# Patient Record
Sex: Female | Born: 1995 | Race: White | Hispanic: No | Marital: Single | Smoking: Current every day smoker
Health system: Southern US, Community
[De-identification: ages and names within clinical notes are randomized; demographics above are authoritative.]

## PROBLEM LIST (undated history)

## (undated) ENCOUNTER — Inpatient Hospital Stay: Payer: Self-pay

## (undated) DIAGNOSIS — N83209 Unspecified ovarian cyst, unspecified side: Secondary | ICD-10-CM

## (undated) DIAGNOSIS — J45909 Unspecified asthma, uncomplicated: Secondary | ICD-10-CM

## (undated) DIAGNOSIS — R894 Abnormal immunological findings in specimens from other organs, systems and tissues: Secondary | ICD-10-CM

## (undated) DIAGNOSIS — N926 Irregular menstruation, unspecified: Secondary | ICD-10-CM

## (undated) DIAGNOSIS — R109 Unspecified abdominal pain: Secondary | ICD-10-CM

## (undated) DIAGNOSIS — T7840XA Allergy, unspecified, initial encounter: Secondary | ICD-10-CM

## (undated) DIAGNOSIS — D689 Coagulation defect, unspecified: Secondary | ICD-10-CM

## (undated) HISTORY — DX: Unspecified ovarian cyst, unspecified side: N83.209

## (undated) HISTORY — DX: Unspecified asthma, uncomplicated: J45.909

## (undated) HISTORY — PX: OVARIAN CYST REMOVAL: SHX89

## (undated) HISTORY — DX: Irregular menstruation, unspecified: N92.6

## (undated) HISTORY — DX: Allergy, unspecified, initial encounter: T78.40XA

## (undated) HISTORY — DX: Unspecified abdominal pain: R10.9

---
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
## 2015-07-08 ENCOUNTER — Encounter: Payer: Self-pay | Admitting: Unknown Physician Specialty

## 2015-07-08 ENCOUNTER — Ambulatory Visit (INDEPENDENT_AMBULATORY_CARE_PROVIDER_SITE_OTHER): Payer: Self-pay | Admitting: Unknown Physician Specialty

## 2015-07-08 VITALS — BP 118/78 | HR 65 | Temp 98.5°F | Ht 62.7 in | Wt 127.0 lb

## 2015-07-08 DIAGNOSIS — Z23 Encounter for immunization: Secondary | ICD-10-CM

## 2015-07-08 DIAGNOSIS — J45909 Unspecified asthma, uncomplicated: Secondary | ICD-10-CM

## 2015-07-08 DIAGNOSIS — J309 Allergic rhinitis, unspecified: Secondary | ICD-10-CM | POA: Insufficient documentation

## 2015-07-08 DIAGNOSIS — K529 Noninfective gastroenteritis and colitis, unspecified: Secondary | ICD-10-CM

## 2015-07-08 DIAGNOSIS — J4520 Mild intermittent asthma, uncomplicated: Secondary | ICD-10-CM | POA: Insufficient documentation

## 2015-07-08 DIAGNOSIS — N926 Irregular menstruation, unspecified: Secondary | ICD-10-CM

## 2015-07-08 NOTE — Progress Notes (Signed)
   BP 118/78 mmHg  Pulse 65  Temp(Src) 98.5 F (36.9 C)  Ht 5' 2.7" (1.593 m)  Wt 127 lb (57.607 kg)  BMI 22.70 kg/m2  SpO2 98%  LMP 06/29/2015 (Exact Date)   Subjective:    Patient ID: Sherry Nunez, female    DOB: 1995-07-14, 20 y.o.   MRN: [id number]  HPI: Sherry Nunez is a 20 y.o. female  Chief Complaint  Patient presents with  . stomach virus    started this weekend, diarrhea   Symptoms started Monday after being around sister with similar symptoms the days before.  She has been vomiting until this AM.  She has diarrhea now.  She is nauseated, has a headache and stomach pain.  She has a sore throat but that is better.   HPI Relevant past medical, surgical, family and social history reviewed and updated as indicated. Interim medical history since our last visit reviewed. Allergies and medications reviewed and updated.  Review of Systems  Per HPI unless specifically indicated above     Objective:    BP 118/78 mmHg  Pulse 65  Temp(Src) 98.5 F (36.9 C)  Ht 5' 2.7" (1.593 m)  Wt 127 lb (57.607 kg)  BMI 22.70 kg/m2  SpO2 98%  LMP 06/29/2015 (Exact Date)  Wt Readings from Last 3 Encounters:  07/08/15 127 lb (57.607 kg)  04/03/14 123 lb (55.792 kg) (40 %*, Z = -0.25)  02/13/12 142 lb (64.411 kg) (78 %*, Z = 0.79)   * Growth percentiles are based on CDC 2-20 Years data.    Physical Exam  Constitutional: She is oriented to person, place, and time. She appears well-developed and well-nourished. No distress.  HENT:  Head: Normocephalic and atraumatic.  Eyes: Conjunctivae and lids are normal. Right eye exhibits no discharge. Left eye exhibits no discharge. No scleral icterus.  Cardiovascular: Normal rate.   Pulmonary/Chest: Effort normal.  Abdominal: Soft. Normal appearance. There is no splenomegaly or hepatomegaly. There is generalized tenderness. There is no rebound and no guarding.  Musculoskeletal: Normal range of motion.  Neurological: She is alert and  oriented to person, place, and time.  Skin: Skin is intact. No rash noted. No pallor.  Psychiatric: She has a normal mood and affect. Her behavior is normal. Judgment and thought content normal.      Assessment & Plan:   Problem List Items Addressed This Visit    None    Visit Diagnoses    Gastroenteritis    -  Primary    Immunization due        Relevant Orders    Flu Vaccine QUAD 36+ mos IM       Supportive care and slow refeeding  Follow up plan: Return if symptoms worsen or fail to improve.

## 2015-07-08 NOTE — Patient Instructions (Signed)

## 2016-02-17 LAB — OB RESULTS CONSOLE HIV ANTIBODY (ROUTINE TESTING): HIV: NONREACTIVE

## 2016-02-17 LAB — OB RESULTS CONSOLE HEPATITIS B SURFACE ANTIGEN: HEP B S AG: NEGATIVE

## 2016-02-17 LAB — OB RESULTS CONSOLE ABO/RH: RH TYPE: POSITIVE

## 2016-02-17 LAB — OB RESULTS CONSOLE ANTIBODY SCREEN: ANTIBODY SCREEN: NEGATIVE

## 2016-02-17 LAB — OB RESULTS CONSOLE RPR: RPR: NONREACTIVE

## 2016-02-17 LAB — OB RESULTS CONSOLE VARICELLA ZOSTER ANTIBODY, IGG: Varicella: IMMUNE

## 2016-02-17 LAB — OB RESULTS CONSOLE HGB/HCT, BLOOD
HCT: 42 %
HEMOGLOBIN: 14.6 g/dL

## 2016-02-17 LAB — OB RESULTS CONSOLE RUBELLA ANTIBODY, IGM: Rubella: IMMUNE

## 2016-02-17 LAB — OB RESULTS CONSOLE GC/CHLAMYDIA
Chlamydia: NEGATIVE
Gonorrhea: NEGATIVE

## 2016-03-11 ENCOUNTER — Encounter: Payer: Self-pay | Admitting: Emergency Medicine

## 2016-03-11 DIAGNOSIS — J45909 Unspecified asthma, uncomplicated: Secondary | ICD-10-CM | POA: Diagnosis not present

## 2016-03-11 DIAGNOSIS — O219 Vomiting of pregnancy, unspecified: Secondary | ICD-10-CM | POA: Diagnosis not present

## 2016-03-11 DIAGNOSIS — O26891 Other specified pregnancy related conditions, first trimester: Secondary | ICD-10-CM | POA: Diagnosis present

## 2016-03-11 DIAGNOSIS — R102 Pelvic and perineal pain: Secondary | ICD-10-CM | POA: Diagnosis not present

## 2016-03-11 DIAGNOSIS — Z3A01 Less than 8 weeks gestation of pregnancy: Secondary | ICD-10-CM | POA: Diagnosis not present

## 2016-03-12 ENCOUNTER — Emergency Department: Payer: Medicaid Other

## 2016-03-12 ENCOUNTER — Emergency Department
Admission: EM | Admit: 2016-03-12 | Discharge: 2016-03-12 | Disposition: A | Discharge status: Home / Self Care | Payer: Medicaid Other | Attending: Emergency Medicine | Admitting: Emergency Medicine

## 2016-03-12 ENCOUNTER — Encounter: Payer: Self-pay | Admitting: *Deleted

## 2016-03-12 DIAGNOSIS — O26891 Other specified pregnancy related conditions, first trimester: Secondary | ICD-10-CM

## 2016-03-12 DIAGNOSIS — R102 Pelvic and perineal pain: Secondary | ICD-10-CM

## 2016-03-12 DIAGNOSIS — R1030 Lower abdominal pain, unspecified: Secondary | ICD-10-CM

## 2016-03-12 LAB — URINALYSIS COMPLETE WITH MICROSCOPIC (ARMC ONLY)
BILIRUBIN URINE: NEGATIVE
Glucose, UA: NEGATIVE mg/dL
HGB URINE DIPSTICK: NEGATIVE
Ketones, ur: NEGATIVE mg/dL
LEUKOCYTES UA: NEGATIVE
NITRITE: NEGATIVE
PH: 7 (ref 5.0–8.0)
PROTEIN: NEGATIVE mg/dL
Specific Gravity, Urine: 1.002 — ABNORMAL LOW (ref 1.005–1.030)

## 2016-03-12 LAB — HCG, QUANTITATIVE, PREGNANCY: hCG, Beta Chain, Quant, S: 121469 m[IU]/mL — ABNORMAL HIGH (ref <5)

## 2016-03-12 NOTE — ED Notes (Addendum)
Pt. States she was told by mid-wife this past Thursday and was told she had small placenta tear.  Pt. Denies any vaginal bleeding.   Pt. States morning sickness symptoms throughout week.  Pt. States some lower abdominal pain and back pain.

## 2016-03-12 NOTE — ED Provider Notes (Signed)
[facility] Emergency Department Provider Note   First MD Initiated Contact with Patient 03/12/16 0209     (approximate)  I have reviewed the triage vital signs and the nursing notes.   HISTORY  Chief Complaint Abdominal Pain    HPI Sherry Nunez is a 20 y.o. female G1 para 0 proximal [redacted] weeks pregnant presents to the emergency department with intermittent sharp lower abdominal pain which started tonight. Patient states that she was seen by midwife at [facility] 2 days ago that the ultrasound that was performed at that time she was told showed a "slight tear between the uterus and the placenta". Patient denies any vaginal bleeding.   Past Medical History:  Diagnosis Date  . Abdominal pain   . Allergy   . Asthma   . Irregular menstrual cycle   . Ovarian cyst     Patient Active Problem List   Diagnosis Date Noted  . Asthma 07/08/2015  . Allergic rhinitis 07/08/2015  . Irregular menstrual cycle 07/08/2015  . Simple constipation 02/13/2012  . Generalized abdominal pain   . Right ovarian cyst     Past Surgical History:  Procedure Laterality Date  . OVARIAN CYST REMOVAL      Prior to Admission medications   Not on File    Allergies Penicillins  Family History  Problem Relation Age of Onset  . GER disease Maternal Uncle     Social History Social History  Substance Use Topics  . Smoking status: Never Smoker  . Smokeless tobacco: Never Used  . Alcohol use Yes    Review of Systems Constitutional: No fever/chills Eyes: No visual changes. ENT: No sore throat. Cardiovascular: Denies chest pain. Respiratory: Denies shortness of breath. Gastrointestinal:Positive for nausea and vomiting No diarrhea.  No constipation.Positive for pelvic pain Genitourinary: Negative for dysuria. Musculoskeletal: Negative for back pain. Skin: Negative for rash. Neurological: Negative for headaches, focal weakness or numbness.  10-point ROS otherwise  negative.  ____________________________________________   PHYSICAL EXAM:  VITAL SIGNS: ED Triage Vitals  Enc Vitals Group     BP 03/11/16 2356 (!) 132/56     Pulse Rate 03/11/16 2356 68     Resp 03/11/16 2356 18     Temp 03/11/16 2356 98.2 F (36.8 C)     Temp Source 03/11/16 2356 Oral     SpO2 03/11/16 2356 100 %     Weight 03/11/16 2358 130 lb (59 kg)     Height 03/11/16 2358 5\' 3"  (1.6 m)     Head Circumference --      Peak Flow --      Pain Score 03/12/16 0005 6     Pain Loc --      Pain Edu? --      Excl. in GC? --     Constitutional: Alert and oriented. Well appearing and in no acute distress. Eyes: Conjunctivae are normal. PERRL. EOMI. Head: Atraumatic. Mouth/Throat: Mucous membranes are moist.  Oropharynx non-erythematous. Neck: No stridor.   Cardiovascular: Normal rate, regular rhythm. Good peripheral circulation. Grossly normal heart sounds. Respiratory: Normal respiratory effort.  No retractions. Lungs CTAB. Gastrointestinal: Soft and nontender. No distention.  Musculoskeletal: No lower extremity tenderness nor edema. No gross deformities of extremities. Neurologic:  Normal speech and language. No gross focal neurologic deficits are appreciated.  Skin:  Skin is warm, dry and intact. No rash noted. Psychiatric: Mood and affect are normal. Speech and behavior are normal.  ____________________________________________   LABS (all labs ordered  are listed, but only abnormal results are displayed)  Labs Reviewed  HCG, QUANTITATIVE, PREGNANCY - Abnormal; Notable for the following:       Result Value   hCG, Beta Chain, Quant, S 121,469 (*)    All other components within normal limits  URINALYSIS COMPLETEWITH MICROSCOPIC ([facility] ONLY) - Abnormal; Notable for the following:    Color, Urine STRAW (*)    APPearance CLEAR (*)    Specific Gravity, Urine 1.002 (*)    Bacteria, UA RARE (*)    Squamous Epithelial / LPF 0-5 (*)    All other components within normal  limits   ___________________________________  RADIOLOGY I, Calamus N BROWN, personally viewed and evaluated these images (plain radiographs) as part of my medical decision making, as well as reviewing the written report by the radiologist.  [place] Ob Comp Less 14 Wks  Result Date: 03/12/2016 CLINICAL DATA:  Acute onset of lower abdominal pain and lower back pain. Initial encounter. EXAM: OBSTETRIC <14 WK US AND TRANSVAGINAL OB US TECHNIQUE: Both transabdominal and transvaginal ultrasound examinations were performed for complete evaluation of the gestation as well as the maternal uterus, adnexal regions, and pelvic cul-de-sac. Transvaginal technique was performed to assess early pregnancy. COMPARISON:  Pelvic ultrasound performed 12/23/2013 FINDINGS: Intrauterine gestational sac: Single; visualized and normal in shape. Yolk sac:  Yes Embryo:  Yes Cardiac Activity: Yes Heart Rate: 140  bpm CRL:  1.2 cm   7 w   3 d                  US EDC: 10/26/2016 Subchorionic hemorrhage:  None visualized. Maternal uterus/adnexae: The uterus is otherwise unremarkable in appearance. A large nabothian cyst is noted at the cervix. The ovaries are within normal limits. The right ovary measures 4.7 x 2.5 x 2.4 cm, while the left ovary measures 3.6 x 2.4 x 2.1 cm. No suspicious adnexal masses are seen; there is no evidence for ovarian torsion. A small amount of free fluid is noted within the pelvic cul-de-sac. IMPRESSION: Single live intrauterine pregnancy noted, with a crown-rump length of 1.2 cm, corresponding to a gestational age of [redacted] weeks 3 days. This matches the gestational age by LMP, reflecting an estimated date of delivery of October 26, 2016. Electronically Signed   By: Roanna RaiderJeffery  Chang M.D.   On: 03/12/2016 02:01   [place] Ob Transvaginal  Result Date: 03/12/2016 CLINICAL DATA:  Acute onset of lower abdominal pain and lower back pain. Initial encounter. EXAM: OBSTETRIC <14 WK US AND TRANSVAGINAL OB US TECHNIQUE: Both  transabdominal and transvaginal ultrasound examinations were performed for complete evaluation of the gestation as well as the maternal uterus, adnexal regions, and pelvic cul-de-sac. Transvaginal technique was performed to assess early pregnancy. COMPARISON:  Pelvic ultrasound performed 12/23/2013 FINDINGS: Intrauterine gestational sac: Single; visualized and normal in shape. Yolk sac:  Yes Embryo:  Yes Cardiac Activity: Yes Heart Rate: 140  bpm CRL:  1.2 cm   7 w   3 d                  US EDC: 10/26/2016 Subchorionic hemorrhage:  None visualized. Maternal uterus/adnexae: The uterus is otherwise unremarkable in appearance. A large nabothian cyst is noted at the cervix. The ovaries are within normal limits. The right ovary measures 4.7 x 2.5 x 2.4 cm, while the left ovary measures 3.6 x 2.4 x 2.1 cm. No suspicious adnexal masses are seen; there is no evidence for ovarian torsion. A small amount of free  fluid is noted within the pelvic cul-de-sac. IMPRESSION: Single live intrauterine pregnancy noted, with a crown-rump length of 1.2 cm, corresponding to a gestational age of [redacted] weeks 3 days. This matches the gestational age by LMP, reflecting an estimated date of delivery of October 26, 2016. Electronically Signed   By: Roanna Raider M.D.   On: 03/12/2016 02:01      Procedures    INITIAL IMPRESSION / ASSESSMENT AND PLAN / ED COURSE  Pertinent labs & imaging results that were available during my care of the patient were reviewed by me and considered in my medical decision making (see chart for details).  Patient advised of clinical warning signs that would warrant return to the emergency department including worsening pain or vaginal bleeding.   Clinical Course    ____________________________________________  FINAL CLINICAL IMPRESSION(S) / ED DIAGNOSES  Final diagnoses:  Pelvic pain affecting pregnancy in first trimester, antepartum     MEDICATIONS GIVEN DURING THIS VISIT:  Medications - No  data to display   NEW OUTPATIENT MEDICATIONS STARTED DURING THIS VISIT:  New Prescriptions   No medications on file    Modified Medications   No medications on file    Discontinued Medications   No medications on file     Note:  This document was prepared using Dragon voice recognition software and may include unintentional dictation errors.    Darci Current, MD 03/12/16 [phone]

## 2016-03-12 NOTE — ED Triage Notes (Signed)
Pt reports she is [redacted] weeks pregnant. Pt was seen by her midwife Thursday for ultrasound and was told that she had a "slight tear between the uterus and placenta". Pt c/o pain in the lower abdomen and lower back that started tonight, no vaginal bleeding or irregular discharge.

## 2016-04-12 ENCOUNTER — Other Ambulatory Visit: Payer: Self-pay | Admitting: Advanced Practice Midwife

## 2016-04-20 ENCOUNTER — Emergency Department
Admission: EM | Admit: 2016-04-20 | Discharge: 2016-04-20 | Disposition: A | Discharge status: Home / Self Care | Payer: Medicaid Other | Attending: Emergency Medicine | Admitting: Emergency Medicine

## 2016-04-20 ENCOUNTER — Emergency Department: Payer: Medicaid Other

## 2016-04-20 ENCOUNTER — Encounter: Payer: Self-pay | Admitting: *Deleted

## 2016-04-20 DIAGNOSIS — F1290 Cannabis use, unspecified, uncomplicated: Secondary | ICD-10-CM | POA: Insufficient documentation

## 2016-04-20 DIAGNOSIS — R102 Pelvic and perineal pain: Secondary | ICD-10-CM

## 2016-04-20 DIAGNOSIS — O99321 Drug use complicating pregnancy, first trimester: Secondary | ICD-10-CM | POA: Insufficient documentation

## 2016-04-20 DIAGNOSIS — R197 Diarrhea, unspecified: Secondary | ICD-10-CM | POA: Insufficient documentation

## 2016-04-20 DIAGNOSIS — Z3A13 13 weeks gestation of pregnancy: Secondary | ICD-10-CM | POA: Insufficient documentation

## 2016-04-20 DIAGNOSIS — J45909 Unspecified asthma, uncomplicated: Secondary | ICD-10-CM | POA: Insufficient documentation

## 2016-04-20 DIAGNOSIS — O26891 Other specified pregnancy related conditions, first trimester: Secondary | ICD-10-CM | POA: Insufficient documentation

## 2016-04-20 DIAGNOSIS — R109 Unspecified abdominal pain: Secondary | ICD-10-CM

## 2016-04-20 DIAGNOSIS — R319 Hematuria, unspecified: Secondary | ICD-10-CM | POA: Diagnosis not present

## 2016-04-20 DIAGNOSIS — O26892 Other specified pregnancy related conditions, second trimester: Secondary | ICD-10-CM

## 2016-04-20 LAB — COMPREHENSIVE METABOLIC PANEL
ALBUMIN: 3.6 g/dL (ref 3.5–5.0)
ALK PHOS: 40 U/L (ref 38–126)
ALT: 12 U/L — ABNORMAL LOW (ref 14–54)
ANION GAP: 6 (ref 5–15)
AST: 22 U/L (ref 15–41)
BUN: 7 mg/dL (ref 6–20)
CALCIUM: 8.8 mg/dL — AB (ref 8.9–10.3)
CHLORIDE: 107 mmol/L (ref 101–111)
CO2: 23 mmol/L (ref 22–32)
Creatinine, Ser: 0.54 mg/dL (ref 0.44–1.00)
GFR calc Af Amer: >60 mL/min (ref >60)
GFR calc non Af Amer: >60 mL/min (ref >60)
GLUCOSE: 72 mg/dL (ref 65–99)
POTASSIUM: 3.5 mmol/L (ref 3.5–5.1)
SODIUM: 136 mmol/L (ref 135–145)
Total Bilirubin: 0.4 mg/dL (ref 0.3–1.2)
Total Protein: 6.8 g/dL (ref 6.5–8.1)

## 2016-04-20 LAB — URINALYSIS COMPLETE WITH MICROSCOPIC (ARMC ONLY)
Bilirubin Urine: NEGATIVE
Glucose, UA: 50 mg/dL — AB
HGB URINE DIPSTICK: NEGATIVE
Ketones, ur: NEGATIVE mg/dL
NITRITE: NEGATIVE
PH: 5 (ref 5.0–8.0)
PROTEIN: NEGATIVE mg/dL
SPECIFIC GRAVITY, URINE: 1.016 (ref 1.005–1.030)

## 2016-04-20 LAB — HCG, QUANTITATIVE, PREGNANCY: HCG, BETA CHAIN, QUANT, S: 106168 m[IU]/mL — AB (ref <5)

## 2016-04-20 LAB — CBC WITH DIFFERENTIAL/PLATELET
BASOS PCT: 1 %
Basophils Absolute: 0 10*3/uL (ref 0–0.1)
EOS ABS: 0.5 10*3/uL (ref 0–0.7)
EOS PCT: 5 %
HCT: 33.1 % — ABNORMAL LOW (ref 35.0–47.0)
HEMOGLOBIN: 11.4 g/dL — AB (ref 12.0–16.0)
Lymphocytes Relative: 14 %
Lymphs Abs: 1.2 10*3/uL (ref 1.0–3.6)
MCH: 31.6 pg (ref 26.0–34.0)
MCHC: 34.4 g/dL (ref 32.0–36.0)
MCV: 91.8 fL (ref 80.0–100.0)
MONOS PCT: 7 %
Monocytes Absolute: 0.6 10*3/uL (ref 0.2–0.9)
NEUTROS PCT: 73 %
Neutro Abs: 6.4 10*3/uL (ref 1.4–6.5)
PLATELETS: 206 10*3/uL (ref 150–440)
RBC: 3.61 MIL/uL — ABNORMAL LOW (ref 3.80–5.20)
RDW: 12.8 % (ref 11.5–14.5)
WBC: 8.7 10*3/uL (ref 3.6–11.0)

## 2016-04-20 MED ORDER — FERROUS SULFATE DRIED ER 160 (50 FE) MG PO TBCR: 160.0000 mg | EXTENDED_RELEASE_TABLET | ORAL | 6 refills | Status: DC

## 2016-04-20 NOTE — ED Notes (Signed)
Patient transported to Ultrasound 

## 2016-04-20 NOTE — ED Triage Notes (Signed)
States sharp left abd pain that began this AM, states she is [redacted] weeks pregnant, states she noticed some vaginal bleeding after she used the bathroom when pain started, states she has experienced morning sickness as well

## 2016-04-20 NOTE — ED Provider Notes (Signed)
[facility] Emergency Department Provider Note        Time seen: ----------------------------------------- 9:01 AM on 04/20/2016 -----------------------------------------    I have reviewed the triage vital signs and the nursing notes.   HISTORY  Chief Complaint Vaginal Bleeding    HPI Sherry Nunez is a 20 y.o. female who presents to ER for abdominal pain that began this morning. Patient states she is [redacted] weeks pregnant. She states she noticed some vaginal bleeding when she went to use the bathroom and wiped. She states it was not coming from her rectum. She has experienced some morning sickness but typically Diclegis has helped her. She denies fevers or chills, has had persistent diarrhea for the last week. She is G1 P0   Past Medical History:  Diagnosis Date  . Abdominal pain   . Allergy   . Asthma   . Irregular menstrual cycle   . Ovarian cyst     Patient Active Problem List   Diagnosis Date Noted  . Asthma 07/08/2015  . Allergic rhinitis 07/08/2015  . Irregular menstrual cycle 07/08/2015  . Simple constipation 02/13/2012  . Generalized abdominal pain   . Right ovarian cyst     Past Surgical History:  Procedure Laterality Date  . OVARIAN CYST REMOVAL      Allergies Penicillins  Social History Social History  Substance Use Topics  . Smoking status: Never Smoker  . Smokeless tobacco: Never Used  . Alcohol use Yes    Review of Systems Constitutional: Negative for fever. Cardiovascular: Negative for chest pain. Respiratory: Negative for shortness of breath. Gastrointestinal: Positive for abdominal pain, diarrhea Genitourinary: Negative for dysuria. positive for hematuria Musculoskeletal: Negative for back pain. Skin: Negative for rash. Neurological: Negative for headaches, focal weakness or numbness.  10-point ROS otherwise negative.  ____________________________________________   PHYSICAL EXAM:  VITAL SIGNS: ED Triage  Vitals [04/20/16 0856]  Enc Vitals Group     BP 111/73     Pulse Rate 75     Resp 18     Temp 98 F (36.7 C)     Temp Source Oral     SpO2 100 %     Weight 144 lb (65.3 kg)     Height 5\' 2"  (1.575 m)     Head Circumference      Peak Flow      Pain Score 8     Pain Loc      Pain Edu?      Excl. in GC?     Constitutional: Alert and oriented. Well appearing and in no distress. Eyes: Conjunctivae are normal. PERRL. Normal extraocular movements. ENT   Head: Normocephalic and atraumatic.   Nose: No congestion/rhinnorhea.   Mouth/Throat: Mucous membranes are moist.   Neck: No stridor. Cardiovascular: Normal rate, regular rhythm. No murmurs, rubs, or gallops. Respiratory: Normal respiratory effort without tachypnea nor retractions. Breath sounds are clear and equal bilaterally. No wheezes/rales/rhonchi. Gastrointestinal: Soft and nontender. Normal bowel sounds Musculoskeletal: Nontender with normal range of motion in all extremities. No lower extremity tenderness nor edema. Neurologic:  Normal speech and language. No gross focal neurologic deficits are appreciated.  Skin:  Skin is warm, dry and intact. No rash noted. Psychiatric: Mood and affect are normal. Speech and behavior are normal.  ___________________________________________  ED COURSE:  Pertinent labs & imaging results that were available during my care of the patient were reviewed by me and considered in my medical decision making (see chart for details). Clinical Course  Patient's no distress, I will assess with labs and likely ultrasound.  Procedures ____________________________________________   LABS (pertinent positives/negatives)  Labs Reviewed  CBC WITH DIFFERENTIAL/PLATELET - Abnormal; Notable for the following:       Result Value   RBC 3.61 (*)    Hemoglobin 11.4 (*)    HCT 33.1 (*)    All other components within normal limits  COMPREHENSIVE METABOLIC PANEL - Abnormal; Notable for the  following:    Calcium 8.8 (*)    ALT 12 (*)    All other components within normal limits  URINALYSIS COMPLETEWITH MICROSCOPIC ([facility] ONLY) - Abnormal; Notable for the following:    Color, Urine YELLOW (*)    APPearance HAZY (*)    Glucose, UA 50 (*)    Leukocytes, UA TRACE (*)    Bacteria, UA RARE (*)    Squamous Epithelial / LPF 6-30 (*)    All other components within normal limits  HCG, QUANTITATIVE, PREGNANCY - Abnormal; Notable for the following:    hCG, Beta Chain, Quant, S 106,168 (*)    All other components within normal limits    RADIOLOGY  Pregnancy ultrasound  IMPRESSION: Single viable injury pregnancy at 13 weeks 0 days . ____________________________________________  FINAL ASSESSMENT AND PLAN  Abdominal pain and pregnancy  Plan: Patient with labs and imaging as dictated above. Patient is in no distress, no specific etiology for symptoms. Labs and imaging are unremarkable. She is stable for outpatient follow-up with OB/GYN.   Emily FilbertWilliams, Jonathan E, MD   Note: This dictation was prepared with Dragon dictation. Any transcriptional errors that result from this process are unintentional    Emily FilbertJonathan E Williams, MD 04/20/16 1100

## 2016-04-21 ENCOUNTER — Ambulatory Visit: Payer: Medicaid Other | Attending: Obstetrics and Gynecology

## 2016-05-05 ENCOUNTER — Ambulatory Visit: Payer: Medicaid Other | Attending: Obstetrics and Gynecology

## 2016-05-23 NOTE — L&D Delivery Note (Signed)
Delivery Note At 10:23 PM a viable female infant was delivered via Vaginal, Spontaneous Delivery (Presentation: LOA ).  APGAR: 7, 9; weight 7 lb 6.9 oz (3370 g).   Placenta status: spontaneous, intact.  Cord: 3VC, nuchal x 1 with the following complications: gestational hypertension, some postpartum atony, 800mcg of cytotec administered rectally.  Cord pH: N/A  Anesthesia:  Epidural Episiotomy: None Lacerations:  none Suture Repair: N/A Est. Blood Loss (mL):  500mL  Mom to postpartum.  Baby to Couplet care / Skin to Skin.  Sherry Nunez 10/19/2016, 10:41 PM

## 2016-06-16 ENCOUNTER — Ambulatory Visit: Payer: Medicaid Other | Attending: Obstetrics and Gynecology

## 2016-06-21 ENCOUNTER — Emergency Department
Admission: EM | Admit: 2016-06-21 | Discharge: 2016-06-21 | Discharge status: Against Medical Advice | Payer: Medicaid Other

## 2016-06-23 ENCOUNTER — Telehealth: Payer: Self-pay | Admitting: Emergency Medicine
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
## 2016-07-06 ENCOUNTER — Other Ambulatory Visit: Payer: Self-pay | Admitting: Obstetrics & Gynecology

## 2016-07-06 DIAGNOSIS — Z362 Encounter for other antenatal screening follow-up: Secondary | ICD-10-CM

## 2016-07-19 ENCOUNTER — Other Ambulatory Visit: Payer: Self-pay | Admitting: Obstetrics & Gynecology

## 2016-07-19 ENCOUNTER — Ambulatory Visit
Admission: RE | Admit: 2016-07-19 | Discharge: 2016-07-19 | Disposition: A | Discharge status: Home / Self Care | Payer: Medicaid Other | Source: Ambulatory Visit | Attending: Obstetrics & Gynecology | Admitting: Obstetrics & Gynecology

## 2016-07-19 DIAGNOSIS — Z3A26 26 weeks gestation of pregnancy: Secondary | ICD-10-CM | POA: Insufficient documentation

## 2016-07-19 DIAGNOSIS — Z369 Encounter for antenatal screening, unspecified: Secondary | ICD-10-CM | POA: Diagnosis not present

## 2016-07-19 DIAGNOSIS — Z362 Encounter for other antenatal screening follow-up: Secondary | ICD-10-CM

## 2016-07-20 ENCOUNTER — Ambulatory Visit (INDEPENDENT_AMBULATORY_CARE_PROVIDER_SITE_OTHER): Payer: Medicaid Other | Admitting: Obstetrics and Gynecology

## 2016-07-20 ENCOUNTER — Encounter: Payer: Self-pay | Admitting: Obstetrics and Gynecology

## 2016-07-20 VITALS — BP 118/76 | Wt 152.0 lb

## 2016-07-20 DIAGNOSIS — O23592 Infection of other part of genital tract in pregnancy, second trimester: Secondary | ICD-10-CM

## 2016-07-20 DIAGNOSIS — O0992 Supervision of high risk pregnancy, unspecified, second trimester: Secondary | ICD-10-CM | POA: Diagnosis not present

## 2016-07-20 DIAGNOSIS — N760 Acute vaginitis: Secondary | ICD-10-CM

## 2016-07-20 DIAGNOSIS — J45909 Unspecified asthma, uncomplicated: Secondary | ICD-10-CM

## 2016-07-20 DIAGNOSIS — Z3A26 26 weeks gestation of pregnancy: Secondary | ICD-10-CM

## 2016-07-20 DIAGNOSIS — O99519 Diseases of the respiratory system complicating pregnancy, unspecified trimester: Secondary | ICD-10-CM

## 2016-07-20 DIAGNOSIS — Z131 Encounter for screening for diabetes mellitus: Secondary | ICD-10-CM

## 2016-07-20 DIAGNOSIS — Z113 Encounter for screening for infections with a predominantly sexual mode of transmission: Secondary | ICD-10-CM

## 2016-07-20 LAB — POCT WET PREP WITH KOH
Clue Cells Wet Prep HPF POC: NEGATIVE
KOH Prep POC: NEGATIVE
Trichomonas, UA: NEGATIVE
Yeast Wet Prep HPF POC: NEGATIVE

## 2016-07-20 NOTE — Progress Notes (Signed)
[facility] screen [facility] at [facility].  No vb. No lof. States she is having some vaginal swelling. Would like a pelvic exam to rule out vaginitis.  Wet prep neg. nuwab sent.

## 2016-07-25 LAB — NUSWAB VAGINITIS PLUS (VG+)
CANDIDA ALBICANS, NAA: POSITIVE — AB
CHLAMYDIA TRACHOMATIS, NAA: NEGATIVE
Candida glabrata, NAA: NEGATIVE
MEGASPHAERA 1: HIGH {score} — AB
Neisseria gonorrhoeae, NAA: NEGATIVE
TRICH VAG BY NAA: NEGATIVE

## 2016-07-26 NOTE — Progress Notes (Signed)
Pt aware.

## 2016-08-03 ENCOUNTER — Other Ambulatory Visit: Payer: Medicaid Other

## 2016-08-03 ENCOUNTER — Ambulatory Visit (INDEPENDENT_AMBULATORY_CARE_PROVIDER_SITE_OTHER): Payer: Medicaid Other | Admitting: Advanced Practice Midwife

## 2016-08-03 VITALS — BP 116/70 | Wt 156.0 lb

## 2016-08-03 DIAGNOSIS — Z3A28 28 weeks gestation of pregnancy: Secondary | ICD-10-CM

## 2016-08-03 DIAGNOSIS — Z131 Encounter for screening for diabetes mellitus: Secondary | ICD-10-CM

## 2016-08-03 DIAGNOSIS — Z3400 Encounter for supervision of normal first pregnancy, unspecified trimester: Secondary | ICD-10-CM | POA: Insufficient documentation

## 2016-08-03 DIAGNOSIS — O0992 Supervision of high risk pregnancy, unspecified, second trimester: Secondary | ICD-10-CM

## 2016-08-03 DIAGNOSIS — Z113 Encounter for screening for infections with a predominantly sexual mode of transmission: Secondary | ICD-10-CM

## 2016-08-03 NOTE — Progress Notes (Signed)
C/o tooth pain related to wisdom teeth coming in. Dental instructions given. 28 week labs today.

## 2016-08-04 LAB — 28 WEEK RH+PANEL
BASOS ABS: 0 10*3/uL (ref 0.0–0.2)
Basos: 0 %
EOS (ABSOLUTE): 0.5 10*3/uL — ABNORMAL HIGH (ref 0.0–0.4)
EOS: 3 %
GESTATIONAL DIABETES SCREEN: 119 mg/dL (ref 65–139)
HIV Screen 4th Generation wRfx: NONREACTIVE
Hematocrit: 29.6 % — ABNORMAL LOW (ref 34.0–46.6)
Hemoglobin: 10 g/dL — ABNORMAL LOW (ref 11.1–15.9)
IMMATURE GRANS (ABS): 0.5 10*3/uL — AB (ref 0.0–0.1)
IMMATURE GRANULOCYTES: 3 %
LYMPHS: 14 %
Lymphocytes Absolute: 2.2 10*3/uL (ref 0.7–3.1)
MCH: 31.1 pg (ref 26.6–33.0)
MCHC: 33.8 g/dL (ref 31.5–35.7)
MCV: 92 fL (ref 79–97)
MONOS ABS: 1.1 10*3/uL — AB (ref 0.1–0.9)
Monocytes: 7 %
NEUTROS PCT: 73 %
Neutrophils Absolute: 11 10*3/uL — ABNORMAL HIGH (ref 1.4–7.0)
PLATELETS: 206 10*3/uL (ref 150–379)
RBC: 3.22 x10E6/uL — ABNORMAL LOW (ref 3.77–5.28)
RDW: 13.1 % (ref 12.3–15.4)
RPR: NONREACTIVE
WBC: 15.3 10*3/uL — ABNORMAL HIGH (ref 3.4–10.8)

## 2016-08-10 ENCOUNTER — Observation Stay
Admission: EM | Admit: 2016-08-10 | Discharge: 2016-08-10 | Disposition: A | Discharge status: Home / Self Care | Payer: Medicaid Other | Attending: Obstetrics and Gynecology | Admitting: Obstetrics and Gynecology

## 2016-08-10 DIAGNOSIS — Z880 Allergy status to penicillin: Secondary | ICD-10-CM | POA: Insufficient documentation

## 2016-08-10 DIAGNOSIS — Z3A29 29 weeks gestation of pregnancy: Secondary | ICD-10-CM | POA: Insufficient documentation

## 2016-08-10 DIAGNOSIS — O368130 Decreased fetal movements, third trimester, not applicable or unspecified: Principal | ICD-10-CM | POA: Insufficient documentation

## 2016-08-10 MED ORDER — ACETAMINOPHEN 325 MG PO TABS: 650.0000 mg | ORAL_TABLET | ORAL | Status: DC | PRN

## 2016-08-10 NOTE — Final Progress Note (Signed)
Physician Final Progress Note  Patient ID: Sherry CannyKayli B Harshfield MRN: [id number] DOB/AGE: Aug 25, 1995 21 y.o.  Admit date: 08/10/2016 Admitting provider: Vena AustriaAndreas Nicolai Labonte, MD Discharge date: 08/10/2016   Admission Diagnoses: Decreased fetal movement  Discharge Diagnoses:  Active Problems:   Labor and delivery indication for care or intervention  21 yo G1P0 .at 8050w0d presenting to L&D with chief complaint of decreased fetal movement  Consults: None  Significant Findings/ Diagnostic Studies: none  Procedures:  NST Baseline: 130 Variability: moderate Accelerations: present Decelerations: absent Tocometry: none The patient was monitored for 30 minutes, fetal heart rate tracing was deemed reactive, category I tracing,   Discharge Condition: good  Disposition: 07-Left Against Medical Advice/Left Without Being Seen/Elopement  Diet: Regular diet  Discharge Activity: Activity as tolerated  Discharge Instructions    Discharge activity:  No Restrictions    Complete by:  As directed    Discharge diet:  No restrictions    Complete by:  As directed    No sexual activity restrictions    Complete by:  As directed    Notify physician for a general feeling that "something is not right"    Complete by:  As directed    Notify physician for increase or change in vaginal discharge    Complete by:  As directed    Notify physician for intestinal cramps, with or without diarrhea, sometimes described as "gas pain"    Complete by:  As directed    Notify physician for leaking of fluid    Complete by:  As directed    Notify physician for low, dull backache, unrelieved by heat or Tylenol    Complete by:  As directed    Notify physician for menstrual like cramps    Complete by:  As directed    Notify physician for pelvic pressure    Complete by:  As directed    Notify physician for uterine contractions.  These may be painless and feel like the uterus is tightening or the baby is  "balling up"     Complete by:  As directed    Notify physician for vaginal bleeding    Complete by:  As directed    PRETERM LABOR:  Includes any of the follwing symptoms that occur between 20 - [redacted] weeks gestation.  If these symptoms are not stopped, preterm labor can result in preterm delivery, placing your baby at risk    Complete by:  As directed      Allergies as of 08/10/2016      Reactions   Penicillins       Medication List    TAKE these medications   ferrous sulfate 160 (50 Fe) MG Tbcr SR tablet Commonly known as:  EQL SLOW RELEASE IRON Take 1 tablet (160 mg total) by mouth 3 (three) times a week.        Total time spent taking care of this patient: 15 minutes triaged via phone.  NST reviewed  Signed: Vena Austriandreas Michiko Lineman 08/10/2016, 11:38 PM

## 2016-08-17 ENCOUNTER — Telehealth: Payer: Self-pay

## 2016-08-17 NOTE — Telephone Encounter (Signed)
Pt calling wanting to know what to do about her tooth.  Needs to make sure she can get done what needs to be done.  Has appt here Mon and c Dr. Willaim BanePark at [facility] Mon.  Adv we have a dental protocol we can fax to her dentist.  Pt gave fax # of [phone].  Protocol faxed and confirmation received.

## 2016-08-22 ENCOUNTER — Ambulatory Visit (INDEPENDENT_AMBULATORY_CARE_PROVIDER_SITE_OTHER): Payer: Medicaid Other | Admitting: Advanced Practice Midwife

## 2016-08-22 VITALS — BP 118/72 | Wt 160.0 lb

## 2016-08-22 DIAGNOSIS — Z3A30 30 weeks gestation of pregnancy: Secondary | ICD-10-CM

## 2016-08-22 NOTE — Progress Notes (Signed)
Doing well. Having BH ctx's. Encouraged to stay hydrated. Mild anemia noted on 28 week labs- Fe samples given. Explained GBS to patient and her mom per their questions.

## 2016-08-22 NOTE — Patient Instructions (Signed)
Pregnancy and Anemia Anemia is a condition in which the concentration of red blood cells or hemoglobin in the blood is below normal. Hemoglobin is a substance in red blood cells that carries oxygen to the tissues of the body. Anemia results in not enough oxygen reaching these tissues. Anemia during pregnancy is common because the fetus uses more iron and folic acid as it is developing. Your body may not produce enough red blood cells because of this. Also, during pregnancy, the liquid part of the blood (plasma) increases by about 50%, and the red blood cells increase by only 25%. This lowers the concentration of the red blood cells and creates a natural anemia-like situation. What are the causes? The most common cause of anemia during pregnancy is not having enough iron in the body to make red blood cells (iron deficiency anemia). Other causes may include:  Folic acid deficiency.  Vitamin B12 deficiency.  Certain prescription or over-the-counter medicines.  Certain medical conditions or infections that destroy red blood cells.  A low platelet count and bleeding caused by antibodies that go through the placenta to the fetus from the mother's blood. What are the signs or symptoms? Mild anemia may not be noticeable. If it becomes severe, symptoms may include:  Tiredness.  Shortness of breath, especially with exercise.  Weakness.  Fainting.  Pale looking skin.  Headaches.  Feeling a fast or irregular heartbeat (palpitations). How is this diagnosed? The type of anemia is usually diagnosed from your family and medical history and blood tests. How is this treated? Treatment of anemia during pregnancy depends on the cause of the anemia. Treatment can include:  Supplements of iron, vitamin B12, or folic acid.  A blood transfusion. This may be needed if blood loss is severe.  Hospitalization. This may be needed if there is significant continual blood loss.  Dietary changes. Follow  these instructions at home:  Follow your dietitian's or health care provider's dietary recommendations.  Increase your vitamin C intake. This will help the stomach absorb more iron.  Eat a diet rich in iron. This would include foods such as:  Liver.  Beef.  Whole grain bread.  Eggs.  Dried fruit.  Take iron and vitamins as directed by your health care provider.  Eat green leafy vegetables. These are a good source of folic acid. Contact a health care provider if:  You have frequent or lasting headaches.  You are looking pale.  You are bruising easily. Get help right away if:  You have extreme weakness, shortness of breath, or chest pain.  You become dizzy or have trouble concentrating.  You have heavy vaginal bleeding.  You develop a rash.  You have bloody or black, tarry stools.  You faint.  You vomit up blood.  You vomit repeatedly.  You have abdominal pain.  You have a fever or persistent symptoms for more than 2-3 days.  You have a fever and your symptoms suddenly get worse.  You are dehydrated. This information is not intended to replace advice given to you by your health care provider. Make sure you discuss any questions you have with your health care provider. Document Released: 05/06/2000 Document Revised: 10/15/2015 Document Reviewed: 12/19/2012 Elsevier Interactive Patient Education  2017 Elsevier Inc.  

## 2016-08-25 DIAGNOSIS — Z8619 Personal history of other infectious and parasitic diseases: Secondary | ICD-10-CM | POA: Insufficient documentation

## 2016-08-25 DIAGNOSIS — E039 Hypothyroidism, unspecified: Secondary | ICD-10-CM | POA: Insufficient documentation

## 2016-08-25 DIAGNOSIS — Z6834 Body mass index (BMI) 34.0-34.9, adult: Secondary | ICD-10-CM | POA: Insufficient documentation

## 2016-08-26 NOTE — Discharge Summary (Signed)
See final progress note. 

## 2016-09-05 ENCOUNTER — Ambulatory Visit (INDEPENDENT_AMBULATORY_CARE_PROVIDER_SITE_OTHER): Payer: Medicaid Other | Admitting: Obstetrics and Gynecology

## 2016-09-05 VITALS — BP 118/76 | Wt 161.0 lb

## 2016-09-05 DIAGNOSIS — Z3400 Encounter for supervision of normal first pregnancy, unspecified trimester: Secondary | ICD-10-CM

## 2016-09-05 DIAGNOSIS — Z6834 Body mass index (BMI) 34.0-34.9, adult: Secondary | ICD-10-CM

## 2016-09-05 DIAGNOSIS — Z3A32 32 weeks gestation of pregnancy: Secondary | ICD-10-CM

## 2016-09-05 NOTE — Progress Notes (Signed)
No vb. No lof.  

## 2016-09-12 ENCOUNTER — Observation Stay
Admission: EM | Admit: 2016-09-12 | Discharge: 2016-09-12 | Disposition: A | Discharge status: Home / Self Care | Payer: Medicaid Other | Attending: Obstetrics and Gynecology | Admitting: Obstetrics and Gynecology

## 2016-09-12 DIAGNOSIS — Z3A33 33 weeks gestation of pregnancy: Secondary | ICD-10-CM | POA: Insufficient documentation

## 2016-09-12 DIAGNOSIS — O26893 Other specified pregnancy related conditions, third trimester: Principal | ICD-10-CM | POA: Insufficient documentation

## 2016-09-12 DIAGNOSIS — Z880 Allergy status to penicillin: Secondary | ICD-10-CM | POA: Diagnosis not present

## 2016-09-12 DIAGNOSIS — R109 Unspecified abdominal pain: Secondary | ICD-10-CM | POA: Insufficient documentation

## 2016-09-12 NOTE — Discharge Instructions (Signed)
LABOR: When contractions begin, you should start to time them from the beginning of one contraction to the beginning of the next.  When contractions are 5-10 minutes apart or less and have been regular for at least an hour, you should call your health care provider.  Notify your doctor if any of the following occur: 1. Bleeding from the vagina 7. Sudden, constant, or occasional abdominal pain  2. Pain or burning when urinating 8. Sudden gushing of fluid from the vagina (with or without continued leaking)  3. Chills or fever 9. Fainting spells, "black outs" or loss of consciousness  4. Increase in vaginal discharge 10. Severe or continued nausea or vomiting  5. Pelvic pressure (sudden increase) 11. Blurring of vision or spots before the eyes  6. Baby moving less than usual 12. Leaking of fluid    FETAL KICK COUNT: Lie on your left side for one hour after a meal, and count the number of times your baby kicks. If it is less than 5 times, get up, move around and drink some juice. Repeat the test 30 minutes later. If it is still less than 5 kicks in an hour, notify your doctor.Braxton Hicks Contractions Contractions of the uterus can occur throughout pregnancy, but they are not always a sign that you are in labor. You may have practice contractions called Braxton Hicks contractions. These false labor contractions are sometimes confused with true labor. What are Sherry Nunez contractions? Braxton Hicks contractions are tightening movements that occur in the muscles of the uterus before labor. Unlike true labor contractions, these contractions do not result in opening (dilation) and thinning of the cervix. Toward the end of pregnancy (32-34 weeks), Braxton Hicks contractions can happen more often and may become stronger. These contractions are sometimes difficult to tell apart from true labor because they can be very uncomfortable. You should not feel embarrassed if you go to the hospital with false  labor. Sometimes, the only way to tell if you are in true labor is for your health care provider to look for changes in the cervix. The health care provider will do a physical exam and may monitor your contractions. If you are not in true labor, the exam should show that your cervix is not dilating and your water has not broken. If there are no prenatal problems or other health problems associated with your pregnancy, it is completely safe for you to be sent home with false labor. You may continue to have Braxton Hicks contractions until you go into true labor. How can I tell the difference between true labor and false labor?  Differences  False labor  Contractions last 30-70 seconds.: Contractions are usually shorter and not as strong as true labor contractions.  Contractions become very regular.: Contractions are usually irregular.  Discomfort is usually felt in the top of the uterus, and it spreads to the lower abdomen and low back.: Contractions are often felt in the front of the lower abdomen and in the groin.  Contractions do not go away with walking.: Contractions may go away when you walk around or change positions while lying down.  Contractions usually become more intense and increase in frequency.: Contractions get weaker and are shorter-lasting as time goes on.  The cervix dilates and gets thinner.: The cervix usually does not dilate or become thin. Follow these instructions at home:  Take over-the-counter and prescription medicines only as told by your health care provider.  Keep up with your usual exercises and follow  other instructions from your health care provider.  Eat and drink lightly if you think you are going into labor.  If Braxton Hicks contractions are making you uncomfortable:  Change your position from lying down or resting to walking, or change from walking to resting.  Sit and rest in a tub of warm water.  Drink enough fluid to keep your urine clear or  pale yellow. Dehydration may cause these contractions.  Do slow and deep breathing several times an hour.  Keep all follow-up prenatal visits as told by your health care provider. This is important. Contact a health care provider if:  You have a fever.  You have continuous pain in your abdomen. Get help right away if:  Your contractions become stronger, more regular, and closer together.  You have fluid leaking or gushing from your vagina.  You pass blood-tinged mucus (bloody show).  You have bleeding from your vagina.  You have low back pain that you never had before.  You feel your babys head pushing down and causing pelvic pressure.  Your baby is not moving inside you as much as it used to. Summary  Contractions that occur before labor are called Braxton Hicks contractions, false labor, or practice contractions.  Braxton Hicks contractions are usually shorter, weaker, farther apart, and less regular than true labor contractions. True labor contractions usually become progressively stronger and regular and they become more frequent.  Manage discomfort from [facility] contractions by changing position, resting in a warm bath, drinking plenty of water, or practicing deep breathing. This information is not intended to replace advice given to you by your health care provider. Make sure you discuss any questions you have with your health care provider. Document Released: 05/09/2005 Document Revised: 03/28/2016 Document Reviewed: 03/28/2016 Elsevier Interactive Patient Education  2017 [employer].

## 2016-09-12 NOTE — Discharge Summary (Signed)
Physician Final Progress Note  Patient ID: Sherry Nunez MRN: [id number] DOB/AGE: 22-Oct-1995 22 y.o.  Admit date: 09/12/2016 Admitting provider: Tresea Mall, CNM Discharge date: 09/12/2016   Admission Diagnoses: abdominal pain  Discharge Diagnoses:  Active Problems:   * No active hospital problems. * IUP at [redacted]w[redacted]d with reactive NST, not in labor  History of Present Illness: The patient is a 21 y.o. female G1P0 at [redacted]w[redacted]d who presents for abdominal pain for the last hour and a half that is described as constant and also as coming and going every 20 minutes. The patient admits positive fetal movement. She admits braxton hicks contractions. She denies LOF, VB, pain with urination. She admits to adequate hydration.    Past Medical History:  Diagnosis Date  . Abdominal pain   . Allergy   . Asthma   . Irregular menstrual cycle   . Ovarian cyst     Past Surgical History:  Procedure Laterality Date  . OVARIAN CYST REMOVAL      No current facility-administered medications on file prior to encounter.    Current Outpatient Prescriptions on File Prior to Encounter  Medication Sig Dispense Refill  . ferrous sulfate (EQL SLOW RELEASE IRON) 160 (50 Fe) MG TBCR SR tablet Take 1 tablet (160 mg total) by mouth 3 (three) times a week. 30 each 6    Allergies  Allergen Reactions  . Penicillins     Social History   Social History  . Marital status: Single    Spouse name: N/A  . Number of children: N/A  . Years of education: N/A   Occupational History  . Not on file.   Social History Main Topics  . Smoking status: Never Smoker  . Smokeless tobacco: Never Used  . Alcohol use Yes  . Drug use: Yes    Types: Marijuana  . Sexual activity: Yes   Other Topics Concern  . Not on file   Social History Narrative   12th grade    Physical Exam: LMP 12/20/2015   Gen: NAD CV: RRR Pulm: CTAB Pelvic: closed, thick, -3, posterior Toco: occasional irritability Fetal Well Being: 120  bpm, moderate variability, +accels, -decels Ext: no evidence of DVT  Consults: None  Significant Findings/ Diagnostic Studies: none  Procedures: NST  Discharge Condition: good  Disposition: 01-Home or Self Care  Diet: Regular diet  Discharge Activity: Activity as tolerated  Discharge Instructions    Discharge activity:  No Restrictions    Complete by:  As directed    Discharge diet:  No restrictions    Complete by:  As directed    Fetal Kick Count:  Lie on our left side for one hour after a meal, and count the number of times your baby kicks.  If it is less than 5 times, get up, move around and drink some juice.  Repeat the test 30 minutes later.  If it is still less than 5 kicks in an hour, notify your doctor.    Complete by:  As directed    No sexual activity restrictions    Complete by:  As directed    Notify physician for a general feeling that "something is not right"    Complete by:  As directed    Notify physician for increase or change in vaginal discharge    Complete by:  As directed    Notify physician for intestinal cramps, with or without diarrhea, sometimes described as "gas pain"    Complete by:  As directed  Notify physician for leaking of fluid    Complete by:  As directed    Notify physician for low, dull backache, unrelieved by heat or Tylenol    Complete by:  As directed    Notify physician for menstrual like cramps    Complete by:  As directed    Notify physician for pelvic pressure    Complete by:  As directed    Notify physician for uterine contractions.  These may be painless and feel like the uterus is tightening or the baby is  "balling up"    Complete by:  As directed    Notify physician for vaginal bleeding    Complete by:  As directed    PRETERM LABOR:  Includes any of the follwing symptoms that occur between 20 - [redacted] weeks gestation.  If these symptoms are not stopped, preterm labor can result in preterm delivery, placing your baby at risk     Complete by:  As directed      Allergies as of 09/12/2016      Reactions   Penicillins       Medication List    TAKE these medications   ferrous sulfate 160 (50 Fe) MG Tbcr SR tablet Commonly known as:  EQL SLOW RELEASE IRON Take 1 tablet (160 mg total) by mouth 3 (three) times a week.      Follow-up Information    [facility] Follow up.   Why:  go to regular scheduled prenatal appointment Contact information: [address] [place] [address] [phone]          Total time spent taking care of this patient: 15 minutes  Signed: Tresea Mall, CNM  09/12/2016, 1:14 AM

## 2016-09-19 ENCOUNTER — Ambulatory Visit (INDEPENDENT_AMBULATORY_CARE_PROVIDER_SITE_OTHER): Payer: Medicaid Other

## 2016-09-19 VITALS — BP 120/90 | Wt 165.0 lb

## 2016-09-19 DIAGNOSIS — Z3402 Encounter for supervision of normal first pregnancy, second trimester: Secondary | ICD-10-CM | POA: Diagnosis not present

## 2016-09-19 DIAGNOSIS — Z3400 Encounter for supervision of normal first pregnancy, unspecified trimester: Secondary | ICD-10-CM

## 2016-09-19 DIAGNOSIS — Z23 Encounter for immunization: Secondary | ICD-10-CM

## 2016-09-19 DIAGNOSIS — Z3A34 34 weeks gestation of pregnancy: Secondary | ICD-10-CM

## 2016-09-19 MED ORDER — FERROUS SULFATE DRIED ER 160 (50 FE) MG PO TBCR: 160.0000 mg | EXTENDED_RELEASE_TABLET | Freq: Every day | ORAL | 6 refills | Status: DC

## 2016-09-19 NOTE — Progress Notes (Signed)
Pos PNVs/Fe. Fe RX eRxd. No VB, LOF. Breast/OCPs. Occas BH contrxns. Contrxn sx improved from last wk at L&D. TdAP today.

## 2016-09-19 NOTE — Addendum Note (Signed)
Addended by: Loran Senters D on: 09/19/2016 10:44 AM   Modules accepted: Orders

## 2016-09-27 ENCOUNTER — Ambulatory Visit (INDEPENDENT_AMBULATORY_CARE_PROVIDER_SITE_OTHER): Payer: Medicaid Other | Admitting: Obstetrics and Gynecology

## 2016-09-27 VITALS — BP 126/62 | Wt 168.0 lb

## 2016-09-27 DIAGNOSIS — Z3A35 35 weeks gestation of pregnancy: Secondary | ICD-10-CM

## 2016-09-27 DIAGNOSIS — Z3685 Encounter for antenatal screening for Streptococcus B: Secondary | ICD-10-CM

## 2016-09-27 DIAGNOSIS — Z3400 Encounter for supervision of normal first pregnancy, unspecified trimester: Secondary | ICD-10-CM

## 2016-09-27 DIAGNOSIS — Z113 Encounter for screening for infections with a predominantly sexual mode of transmission: Secondary | ICD-10-CM

## 2016-09-27 NOTE — Progress Notes (Signed)
Discussed hemrrhoids in pregnancy, no external hemerrhoids one time episode only noticed when whipping.  No constipation.  GBS and aptima collected today

## 2016-09-27 NOTE — Progress Notes (Signed)
Bellybutton pain/ bleeding w/BM once

## 2016-09-27 NOTE — Patient Instructions (Signed)
Lower Gastrointestinal Bleeding °Lower gastrointestinal (GI) bleeding is the result of bleeding from the colon, rectum, or anal area. The colon is the last part of the digestive tract, where stool, also called feces, is formed. If you have lower GI bleeding, you may see blood in or on your stool. It may be bright red. °Lower GI bleeding often stops without treatment. Continued or heavy bleeding needs emergency treatment at the hospital. °What are the causes? °Lower GI bleeding may be caused by: °· A condition that causes pouches to form in the colon over time (diverticulosis). °· Swelling and irritation (inflammation) in areas with diverticulosis (diverticulitis). °· Inflammation of the colon (inflammatory bowel disease). °· Swollen veins in the rectum (hemorrhoids). °· Painful tears in the anus (anal fissures), often caused by passing hard stools. °· Cancer of the colon or rectum. °· Noncancerous growths (polyps) of the colon or rectum. °· A bleeding disorder that impairs the formation of blood clots and causes easy bleeding (coagulopathy). °· An abnormal weakening of a blood vessel where an artery and a vein come together (arteriovenous malformation). °What increases the risk? °You are more likely to develop this condition if: °· You are older than 22 years of age. °· You take aspirin or NSAIDs on a regular basis. °· You take anticoagulant or antiplatelet drugs. °· You have a history of high-dose X-ray treatment (radiation therapy) of the colon. °· You recently had a colon polyp removed. °What are the signs or symptoms? °Symptoms of this condition include: °· Bright red blood or blood clots coming from your rectum. °· Bloody stools. °· Black or maroon-colored stools. °· Pain or cramping in the abdomen. °· Weakness or dizziness. °· Racing heartbeat. °How is this diagnosed? °This condition may be diagnosed based on: °· Your symptoms and medical history. °· A physical exam. During the exam, your health care provider  will check for signs of blood loss, such as low blood pressure and a rapid pulse. °· Tests, such as: °¨ Flexible sigmoidoscopy. In this procedure, a flexible tube with a camera on the end is used to examine your anus and the first part of your colon to look for the source of bleeding. °¨ Colonoscopy. This is similar to a flexible sigmoidoscopy, but the camera can extend all the way to the uppermost part of your colon. °¨ Blood tests to measure your red blood cell count and to check for coagulopathy. °¨ An imaging study of your colon to look for a bleeding site. In some cases, you may have X-rays taken after a dye or radioactive substance is injected into your bloodstream (angiogram). °How is this treated? °Treatment for this condition depends on the cause of the bleeding. Heavy or persistent bleeding is treated at the hospital. Treatment may include: °· Getting fluids through an IV tube inserted into one of your veins. °· Getting blood through an IV tube (blood transfusion). °· Stopping bleeding through high-heat coagulation, injections of certain medicines, or applying surgical clips. This can all be done during a colonoscopy. °· Having a procedure that involves first doing an angiogram and then blocking blood flow to the bleeding site (embolization). °· Stopping some of your regular medicines for a certain amount of time. °· Having surgery to remove part of the colon. This may be needed if bleeding is severe and does not respond to other treatment. °Follow these instructions at home: °· Take over-the-counter and prescription medicines only as told by your health care provider. You may need to avoid   aspirin, NSAIDs, or other medicines that increase bleeding. °· Eat foods that are high in fiber. This will help keep your stools soft. These foods include whole grains, legumes, fruits, and vegetables. Eating 1-3 prunes each day works well for many people. °· Drink enough fluid to keep your urine clear or pale  yellow. °· Keep all follow-up visits as told by your health care provider. This is important. °Contact a health care provider if: °· Your symptoms do not improve. °Get help right away if: °· Your bleeding increases. °· You feel light-headed or you faint. °· You feel weak. °· You have severe cramps in your back or abdomen. °· You pass large blood clots in your stool. °· Your symptoms get worse. °This information is not intended to replace advice given to you by your health care provider. Make sure you discuss any questions you have with your health care provider. °Document Released: 09/24/2015 Document Revised: 10/15/2015 Document Reviewed: 09/24/2015 °Elsevier Interactive Patient Education © 2017 Elsevier Inc. ° °

## 2016-09-29 LAB — STREP GP B NAA: Strep Gp B NAA: NEGATIVE

## 2016-10-01 LAB — GC/CHLAMYDIA PROBE AMP
Chlamydia trachomatis, NAA: NEGATIVE
Neisseria gonorrhoeae by PCR: NEGATIVE

## 2016-10-03 ENCOUNTER — Ambulatory Visit (INDEPENDENT_AMBULATORY_CARE_PROVIDER_SITE_OTHER): Payer: Medicaid Other | Admitting: Obstetrics and Gynecology

## 2016-10-03 VITALS — BP 122/70 | Wt 170.0 lb

## 2016-10-03 DIAGNOSIS — Z8619 Personal history of other infectious and parasitic diseases: Secondary | ICD-10-CM

## 2016-10-03 DIAGNOSIS — Z3400 Encounter for supervision of normal first pregnancy, unspecified trimester: Secondary | ICD-10-CM

## 2016-10-03 DIAGNOSIS — Z6834 Body mass index (BMI) 34.0-34.9, adult: Secondary | ICD-10-CM

## 2016-10-03 DIAGNOSIS — Z3A36 36 weeks gestation of pregnancy: Secondary | ICD-10-CM

## 2016-10-03 NOTE — Progress Notes (Signed)
No vb. No lof.  

## 2016-10-10 ENCOUNTER — Encounter: Payer: Medicaid Other | Admitting: Obstetrics and Gynecology

## 2016-10-10 ENCOUNTER — Ambulatory Visit (INDEPENDENT_AMBULATORY_CARE_PROVIDER_SITE_OTHER): Payer: Medicaid Other | Admitting: Obstetrics & Gynecology

## 2016-10-10 VITALS — BP 130/88 | Wt 172.0 lb

## 2016-10-10 DIAGNOSIS — Z3A37 37 weeks gestation of pregnancy: Secondary | ICD-10-CM

## 2016-10-10 DIAGNOSIS — Z3400 Encounter for supervision of normal first pregnancy, unspecified trimester: Secondary | ICD-10-CM

## 2016-10-10 NOTE — Progress Notes (Signed)
PNV, FMC, Labor precautions  

## 2016-10-14 ENCOUNTER — Encounter: Payer: Self-pay | Admitting: *Deleted

## 2016-10-14 ENCOUNTER — Observation Stay
Admission: EM | Admit: 2016-10-14 | Discharge: 2016-10-14 | Disposition: A | Discharge status: Home / Self Care | Payer: Medicaid Other | Attending: Obstetrics and Gynecology | Admitting: Obstetrics and Gynecology

## 2016-10-14 DIAGNOSIS — M545 Low back pain, unspecified: Secondary | ICD-10-CM | POA: Diagnosis present

## 2016-10-14 DIAGNOSIS — Z3A38 38 weeks gestation of pregnancy: Secondary | ICD-10-CM

## 2016-10-14 DIAGNOSIS — O26893 Other specified pregnancy related conditions, third trimester: Secondary | ICD-10-CM | POA: Diagnosis not present

## 2016-10-14 DIAGNOSIS — O368130 Decreased fetal movements, third trimester, not applicable or unspecified: Secondary | ICD-10-CM | POA: Insufficient documentation

## 2016-10-14 LAB — URINALYSIS, COMPLETE (UACMP) WITH MICROSCOPIC
BILIRUBIN URINE: NEGATIVE
Bacteria, UA: NONE SEEN
GLUCOSE, UA: NEGATIVE mg/dL
HGB URINE DIPSTICK: NEGATIVE
KETONES UR: NEGATIVE mg/dL
LEUKOCYTES UA: NEGATIVE
NITRITE: NEGATIVE
PH: 7 (ref 5.0–8.0)
Protein, ur: NEGATIVE mg/dL
Specific Gravity, Urine: 1.006 (ref 1.005–1.030)

## 2016-10-14 LAB — CBC
HEMATOCRIT: 26.9 % — AB (ref 35.0–47.0)
HEMOGLOBIN: 9.4 g/dL — AB (ref 12.0–16.0)
MCH: 31.3 pg (ref 26.0–34.0)
MCHC: 34.9 g/dL (ref 32.0–36.0)
MCV: 89.6 fL (ref 80.0–100.0)
Platelets: 192 10*3/uL (ref 150–440)
RBC: 3 MIL/uL — ABNORMAL LOW (ref 3.80–5.20)
RDW: 13.1 % (ref 11.5–14.5)
WBC: 14 10*3/uL — AB (ref 3.6–11.0)

## 2016-10-14 LAB — COMPREHENSIVE METABOLIC PANEL
ALBUMIN: 2.4 g/dL — AB (ref 3.5–5.0)
ALK PHOS: 223 U/L — AB (ref 38–126)
ALT: 11 U/L — ABNORMAL LOW (ref 14–54)
AST: 21 U/L (ref 15–41)
Anion gap: 4 — ABNORMAL LOW (ref 5–15)
BILIRUBIN TOTAL: 0.4 mg/dL (ref 0.3–1.2)
BUN: 5 mg/dL — AB (ref 6–20)
CALCIUM: 8.3 mg/dL — AB (ref 8.9–10.3)
CO2: 24 mmol/L (ref 22–32)
CREATININE: 0.5 mg/dL (ref 0.44–1.00)
Chloride: 108 mmol/L (ref 101–111)
GFR calc Af Amer: >60 mL/min (ref >60)
GFR calc non Af Amer: >60 mL/min (ref >60)
GLUCOSE: 95 mg/dL (ref 65–99)
Potassium: 2.9 mmol/L — ABNORMAL LOW (ref 3.5–5.1)
Sodium: 136 mmol/L (ref 135–145)
TOTAL PROTEIN: 5.5 g/dL — AB (ref 6.5–8.1)

## 2016-10-14 LAB — PROTEIN / CREATININE RATIO, URINE
Creatinine, Urine: 65 mg/dL
Protein Creatinine Ratio: 0.22 mg/mg{Cre} — ABNORMAL HIGH (ref 0.00–0.15)
Total Protein, Urine: 14 mg/dL

## 2016-10-14 MED ORDER — POTASSIUM CHLORIDE CRYS ER 20 MEQ PO TBCR: 20.0000 meq | EXTENDED_RELEASE_TABLET | Freq: Every day | ORAL | 1 refills | Status: DC

## 2016-10-14 MED ORDER — ACETAMINOPHEN 325 MG PO TABS: 650.0000 mg | ORAL_TABLET | ORAL | Status: DC | PRN

## 2016-10-14 NOTE — Discharge Instructions (Signed)
Come back if:  Big gush of fluid Decreased fetal movement Heavy vaginal bleeding Decreased fetal movement Contractions every 3-5 min lasting at least one hour  Get plenty of rest and stay well hydrated!

## 2016-10-14 NOTE — OB Triage Note (Signed)
Recvd pt from ED. Pt c/o lower back pain that comes and goes and abdominal pain that started earlier this evening. Stated that she wasn't feeling baby move as often when the abdominal pain started. No vaginal bleeding or leaking of fluid. Rates pain an 8 out of 10.

## 2016-10-14 NOTE — Discharge Summary (Signed)
See final progress note. 

## 2016-10-14 NOTE — Final Progress Note (Signed)
Physician Final Progress Note  Patient ID: Sherry Nunez MRN: [id number] DOB/AGE: Jun 22, 1995 21 y.o.  Admit date: 10/14/2016 Admitting provider: Vena Austria, MD Discharge date: 10/14/2016   Admission Diagnoses: Lumbago, decreased fetal movement, and elevated BP  Discharge Diagnoses:  Active Problems:   Lumbago  21 yo G1P0 at [redacted]w[redacted]d presenting with lumbago and decreased fetal movement.  On presentation initial BP elevated.  Repeat BP normotensive.  Patient asymptomatic, was relatively anxious on presentation.  No HA, vision changes, increased edema, RUQ or epigastic pain.  Labs normal.  Follow up on 10/18/16 in clinic.  +FM after presentation, no LOF, no VB, no ctx.  Potassium repleted   Vitals:   10/14/16 0240 10/14/16 0310 10/14/16 0340 10/14/16 0410  BP: 121/73 132/79 127/66 (!) 119/58  Pulse: 85 83 79 70  Resp:      Temp:      TempSrc:      Weight:      Height:        Consults: None  Significant Findings/ Diagnostic Studies:  Results for orders placed or performed during the hospital encounter of 10/14/16 (from the past 24 hour(s))  Urinalysis, Complete w Microscopic     Status: Abnormal   Collection Time: 10/14/16  1:38 AM  Result Value Ref Range   Color, Urine YELLOW (A) YELLOW   APPearance CLEAR (A) CLEAR   Specific Gravity, Urine 1.006 1.005 - 1.030   pH 7.0 5.0 - 8.0   Glucose, UA NEGATIVE NEGATIVE mg/dL   Hgb urine dipstick NEGATIVE NEGATIVE   Bilirubin Urine NEGATIVE NEGATIVE   Ketones, ur NEGATIVE NEGATIVE mg/dL   Protein, ur NEGATIVE NEGATIVE mg/dL   Nitrite NEGATIVE NEGATIVE   Leukocytes, UA NEGATIVE NEGATIVE   RBC / HPF 0-5 0 - 5 RBC/hpf   WBC, UA 0-5 0 - 5 WBC/hpf   Bacteria, UA NONE SEEN NONE SEEN   Squamous Epithelial / LPF 0-5 (A) NONE SEEN   Mucous PRESENT   Protein / creatinine ratio, urine     Status: Abnormal   Collection Time: 10/14/16  1:40 AM  Result Value Ref Range   Creatinine, Urine 65 mg/dL   Total Protein, Urine 14 mg/dL    Protein Creatinine Ratio 0.22 (H) 0.00 - 0.15 mg/mg[Cre]  CBC     Status: Abnormal   Collection Time: 10/14/16  2:55 AM  Result Value Ref Range   WBC 14.0 (H) 3.6 - 11.0 K/uL   RBC 3.00 (L) 3.80 - 5.20 MIL/uL   Hemoglobin 9.4 (L) 12.0 - 16.0 g/dL   HCT 40.9 (L) 81.1 - 91.4 %   MCV 89.6 80.0 - 100.0 fL   MCH 31.3 26.0 - 34.0 pg   MCHC 34.9 32.0 - 36.0 g/dL   RDW 78.2 95.6 - 21.3 %   Platelets 192 150 - 440 K/uL  Comprehensive metabolic panel     Status: Abnormal   Collection Time: 10/14/16  2:55 AM  Result Value Ref Range   Sodium 136 135 - 145 mmol/L   Potassium 2.9 (L) 3.5 - 5.1 mmol/L   Chloride 108 101 - 111 mmol/L   CO2 24 22 - 32 mmol/L   Glucose, Bld 95 65 - 99 mg/dL   BUN 5 (L) 6 - 20 mg/dL   Creatinine, Ser 0.86 0.44 - 1.00 mg/dL   Calcium 8.3 (L) 8.9 - 10.3 mg/dL   Total Protein 5.5 (L) 6.5 - 8.1 g/dL   Albumin 2.4 (L) 3.5 - 5.0 g/dL   AST 21 15 -  41 U/L   ALT 11 (L) 14 - 54 U/L   Alkaline Phosphatase 223 (H) 38 - 126 U/L   Total Bilirubin 0.4 0.3 - 1.2 mg/dL   GFR calc non Af Amer >60 >60 mL/min   GFR calc Af Amer >60 >60 mL/min   Anion gap 4 (L) 5 - 15   Procedures:  Baseline: 120 Variability: moderate Accelerations: present Decelerations: absent Tocometry: none The patient was monitored for 30 minutes, fetal heart rate tracing was deemed reactive, category I tracing,   Discharge Condition: good  Disposition: 01-Home or Self Care  Diet: Regular diet  Discharge Activity: Activity as tolerated  Discharge Instructions    Discharge activity:  No Restrictions    Complete by:  As directed    Discharge diet:  No restrictions    Complete by:  As directed    Fetal Kick Count:  Lie on our left side for one hour after a meal, and count the number of times your baby kicks.  If it is less than 5 times, get up, move around and drink some juice.  Repeat the test 30 minutes later.  If it is still less than 5 kicks in an hour, notify your doctor.    Complete by:  As  directed    LABOR:  When conractions begin, you should start to time them from the beginning of one contraction to the beginning  of the next.  When contractions are 5 - 10 minutes apart or less and have been regular for at least an hour, you should call your health care provider.    Complete by:  As directed    No sexual activity restrictions    Complete by:  As directed    Notify physician for bleeding from the vagina    Complete by:  As directed    Notify physician for blurring of vision or spots before the eyes    Complete by:  As directed    Notify physician for chills or fever    Complete by:  As directed    Notify physician for fainting spells, "black outs" or loss of consciousness    Complete by:  As directed    Notify physician for increase in vaginal discharge    Complete by:  As directed    Notify physician for leaking of fluid    Complete by:  As directed    Notify physician for pain or burning when urinating    Complete by:  As directed    Notify physician for pelvic pressure (sudden increase)    Complete by:  As directed    Notify physician for severe or continued nausea or vomiting    Complete by:  As directed    Notify physician for sudden gushing of fluid from the vagina (with or without continued leaking)    Complete by:  As directed    Notify physician for sudden, constant, or occasional abdominal pain    Complete by:  As directed    Notify physician if baby moving less than usual    Complete by:  As directed      Allergies as of 10/14/2016      Reactions   Penicillins       Medication List    TAKE these medications   ferrous sulfate 160 (50 Fe) MG Tbcr SR tablet Commonly known as:  EQL SLOW RELEASE IRON Take 1 tablet (160 mg total) by mouth daily.   potassium chloride SA 20 MEQ tablet  Commonly known as:  K-DUR,KLOR-CON Take 1 tablet (20 mEq total) by mouth daily.   prenatal multivitamin Tabs tablet Take 1 tablet by mouth daily at 12 noon.       Follow-up Information    Vena AustriaStaebler, Travis Mastel, MD Follow up.   Specialty:  Obstetrics and Gynecology Contact information: [address] [place] [address] [phone]           Total time spent taking care of this patient: 45 minutes  Signed: Vena Austriandreas Lenton Gendreau 10/14/2016, 4:21 AM

## 2016-10-18 ENCOUNTER — Inpatient Hospital Stay
Admission: EM | Admit: 2016-10-18 | Discharge: 2016-10-22 | DRG: 775 | Disposition: A | Discharge status: Home / Self Care | Payer: Medicaid Other | Source: Ambulatory Visit | Attending: Obstetrics and Gynecology | Admitting: Obstetrics and Gynecology

## 2016-10-18 ENCOUNTER — Ambulatory Visit (INDEPENDENT_AMBULATORY_CARE_PROVIDER_SITE_OTHER): Payer: Medicaid Other | Admitting: Advanced Practice Midwife

## 2016-10-18 VITALS — BP 140/98 | Wt 174.0 lb

## 2016-10-18 DIAGNOSIS — O133 Gestational [pregnancy-induced] hypertension without significant proteinuria, third trimester: Secondary | ICD-10-CM

## 2016-10-18 DIAGNOSIS — D62 Acute posthemorrhagic anemia: Secondary | ICD-10-CM | POA: Diagnosis not present

## 2016-10-18 DIAGNOSIS — O134 Gestational [pregnancy-induced] hypertension without significant proteinuria, complicating childbirth: Principal | ICD-10-CM | POA: Diagnosis present

## 2016-10-18 DIAGNOSIS — Z880 Allergy status to penicillin: Secondary | ICD-10-CM

## 2016-10-18 DIAGNOSIS — R030 Elevated blood-pressure reading, without diagnosis of hypertension: Secondary | ICD-10-CM | POA: Diagnosis present

## 2016-10-18 DIAGNOSIS — O139 Gestational [pregnancy-induced] hypertension without significant proteinuria, unspecified trimester: Secondary | ICD-10-CM | POA: Diagnosis present

## 2016-10-18 DIAGNOSIS — Z3A38 38 weeks gestation of pregnancy: Secondary | ICD-10-CM

## 2016-10-18 DIAGNOSIS — O9081 Anemia of the puerperium: Secondary | ICD-10-CM | POA: Diagnosis not present

## 2016-10-18 LAB — COMPREHENSIVE METABOLIC PANEL
ALT: 12 U/L — ABNORMAL LOW (ref 14–54)
AST: 21 U/L (ref 15–41)
Albumin: 2.6 g/dL — ABNORMAL LOW (ref 3.5–5.0)
Alkaline Phosphatase: 254 U/L — ABNORMAL HIGH (ref 38–126)
Anion gap: 6 (ref 5–15)
BUN: <5 mg/dL — ABNORMAL LOW (ref 6–20)
CHLORIDE: 108 mmol/L (ref 101–111)
CO2: 21 mmol/L — ABNORMAL LOW (ref 22–32)
Calcium: 9 mg/dL (ref 8.9–10.3)
Creatinine, Ser: 0.54 mg/dL (ref 0.44–1.00)
Glucose, Bld: 92 mg/dL (ref 65–99)
POTASSIUM: 3.3 mmol/L — AB (ref 3.5–5.1)
Sodium: 135 mmol/L (ref 135–145)
Total Bilirubin: 0.6 mg/dL (ref 0.3–1.2)
Total Protein: 5.8 g/dL — ABNORMAL LOW (ref 6.5–8.1)

## 2016-10-18 LAB — CBC
HEMATOCRIT: 28.6 % — AB (ref 35.0–47.0)
HEMOGLOBIN: 9.8 g/dL — AB (ref 12.0–16.0)
MCH: 30.7 pg (ref 26.0–34.0)
MCHC: 34.3 g/dL (ref 32.0–36.0)
MCV: 89.6 fL (ref 80.0–100.0)
Platelets: 224 10*3/uL (ref 150–440)
RBC: 3.19 MIL/uL — AB (ref 3.80–5.20)
RDW: 12.9 % (ref 11.5–14.5)
WBC: 14 10*3/uL — AB (ref 3.6–11.0)

## 2016-10-18 LAB — PROTEIN / CREATININE RATIO, URINE
Creatinine, Urine: 33 mg/dL
PROTEIN CREATININE RATIO: 0.18 mg/mg{creat} — AB (ref 0.00–0.15)
Total Protein, Urine: 6 mg/dL

## 2016-10-18 MED ORDER — ZOLPIDEM TARTRATE 5 MG PO TABS
5.0000 mg | ORAL_TABLET | Freq: Every evening | ORAL | Status: DC | PRN
  Administered 2016-10-18: 5 mg via ORAL
  Filled 2016-10-18: qty 1

## 2016-10-18 MED ORDER — OXYTOCIN 40 UNITS IN LACTATED RINGERS INFUSION - SIMPLE MED: 2.5000 [IU]/h | INTRAVENOUS | Status: DC

## 2016-10-18 MED ORDER — TERBUTALINE SULFATE 1 MG/ML IJ SOLN: 0.2500 mg | Freq: Once | INTRAMUSCULAR | Status: DC | PRN

## 2016-10-18 MED ORDER — MISOPROSTOL 25 MCG QUARTER TABLET
25.0000 ug | ORAL_TABLET | ORAL | Status: DC | PRN
  Administered 2016-10-19: 25 ug via VAGINAL
  Administered 2016-10-19: 800 ug via VAGINAL
  Filled 2016-10-18: qty 1

## 2016-10-18 MED ORDER — LACTATED RINGERS IV SOLN
INTRAVENOUS | Status: DC
  Administered 2016-10-19 (×2): via INTRAVENOUS

## 2016-10-18 MED ORDER — SODIUM CHLORIDE FLUSH 0.9 % IV SOLN
INTRAVENOUS | Status: AC
  Filled 2016-10-18: qty 20

## 2016-10-18 MED ORDER — ACETAMINOPHEN 325 MG PO TABS: 650.0000 mg | ORAL_TABLET | ORAL | Status: DC | PRN

## 2016-10-18 MED ORDER — ONDANSETRON HCL 4 MG/2ML IJ SOLN
4.0000 mg | Freq: Four times a day (QID) | INTRAMUSCULAR | Status: DC | PRN
  Administered 2016-10-19: 4 mg via INTRAVENOUS
  Filled 2016-10-18: qty 2

## 2016-10-18 MED ORDER — LACTATED RINGERS IV SOLN: 500.0000 mL | INTRAVENOUS | Status: DC | PRN

## 2016-10-18 MED ORDER — BUTORPHANOL TARTRATE 1 MG/ML IJ SOLN
1.0000 mg | INTRAMUSCULAR | Status: DC | PRN
  Administered 2016-10-19: 1 mg via INTRAVENOUS
  Filled 2016-10-18: qty 2

## 2016-10-18 MED ORDER — OXYTOCIN BOLUS FROM INFUSION
500.0000 mL | Freq: Once | INTRAVENOUS | Status: AC
  Administered 2016-10-19: 500 mL via INTRAVENOUS

## 2016-10-18 MED ORDER — HYDRALAZINE HCL 20 MG/ML IJ SOLN
5.0000 mg | INTRAMUSCULAR | Status: DC | PRN
  Filled 2016-10-18: qty 0.5

## 2016-10-18 NOTE — Progress Notes (Signed)
Has been having regular contractions q night. Swelling in lower extremities. Denies HA, visual changes, epigastric pain. Recheck of BP: 140/98. Discussed with Dr Jean RosenthalJackson- recommendation to go to L&D for evaluation.

## 2016-10-18 NOTE — OB Triage Note (Signed)
Pt G1P0 289w6d send over from office for elevated BPs. Pt denies h/a, blurry vision, seeing spots, +1 reflexes, absent clonus. Pt states + FM, denies leaking of fluid/vaginal bleeding. Pt denies feeling ctx currently. Monitors applied and assessing.

## 2016-10-18 NOTE — H&P (Signed)
Obstetrics Admission History & Physical   Hypertension   HPI:  21 y.o. G1P0 @ [redacted]w[redacted]d (10/26/2016, by Ultrasound). Admitted on 10/18/2016:   Patient Active Problem List   Diagnosis Date Noted  . Indication for care in labor and delivery, antepartum 10/18/2016  . Lumbago 10/14/2016  . Hypothyroid 08/25/2016  . History of trichomoniasis 08/25/2016  . BMI 34.0-34.9,adult 08/25/2016  . Supervision of normal first pregnancy, antepartum 08/03/2016  . Asthma 07/08/2015  . Allergic rhinitis 07/08/2015  . Irregular menstrual cycle 07/08/2015  . Simple constipation 02/13/2012  . Generalized abdominal pain   . Right ovarian cyst     Presents for elevated BP in third trimester. Seen in office today at 38 6/7 weeks with no sx's of preeclampsia a she has no headache, blurry vision, epigastric pain, CP or SOB.  Some edema.  Good FM.  Reports ctxs as she has been having with low back pian. Denies VB or LOF.  No prior h/o HTN.  Prenatal care at: at [facility]. Pregnancy complicated by none.  ROS: A review of systems was performed and negative, except as stated in the above HPI. PMHx:  Past Medical History:  Diagnosis Date  . Abdominal pain   . Allergy   . Asthma   . Irregular menstrual cycle   . Ovarian cyst    PSHx:  Past Surgical History:  Procedure Laterality Date  . OVARIAN CYST REMOVAL     Medications:  Prescriptions Prior to Admission  Medication Sig Dispense Refill Last Dose  . ferrous sulfate (EQL SLOW RELEASE IRON) 160 (50 Fe) MG TBCR SR tablet Take 1 tablet (160 mg total) by mouth daily. 30 each 6 10/18/2016 at Unknown time  . potassium chloride SA (K-DUR,KLOR-CON) 20 MEQ tablet Take 1 tablet (20 mEq total) by mouth daily. 30 tablet 1 10/18/2016 at Unknown time  . Prenatal Vit-Fe Fumarate-FA (PRENATAL MULTIVITAMIN) TABS tablet Take 1 tablet by mouth daily at 12 noon.   10/18/2016 at Unknown time   Allergies: is allergic to penicillins. OBHx:  OB History  Gravida Para Term Preterm AB  Living  1            SAB TAB Ectopic Multiple Live Births               # Outcome Date GA Lbr Len/2nd Weight Sex Delivery Anes PTL Lv  1 Current              ZOX:[id number] except as detailed in HPI.Marland Kitchen  No family history of birth defects. Soc Hx: Alcohol: none, Recreational drug use: none, Denies domestic abuse and Pregnancy welcomed  Objective:   Vitals:   10/18/16 2045 10/18/16 2048  BP: (!) 145/94 (!) 145/94  Pulse: 77 77  Resp:    Temp:    Serial BP have consistantly been in ton 130-140s/80s, with high in the 150/90s. Constitutional: Well nourished, well developed female in no acute distress.  HEENT: normal Skin: Warm and dry.  Cardiovascular:Regular rate and rhythm.   Extremity: 1+ bilateral pedal edema Respiratory: Clear to auscultation bilateral. Normal respiratory effort Abdomen: mild Back: no CVAT Neuro: DTRs 2+, Cranial nerves grossly intact Psych: Alert and Oriented x3. No memory deficits. Normal mood and affect.  MS: normal gait, normal bilateral lower extremity ROM/strength/stability.  Pelvic exam: is not limited by body habitus EGBUS: within normal limits Vagina: within normal limits and with normal mucosa blood in the vault Cervix: 1/60/-3, VTX Uterus: Spontaneous uterine activity  Adnexa: not evaluated  EFM:FHR: 130 bpm, variability:  moderate,  accelerations:  Present,  decelerations:  Absent Toco: Frequency: Every 5-10 minutes   Perinatal info:  Blood type: A positive Rubella- Immune Varicella -Immune TDaP Given during third trimester of this pregnancy RPR NR / HIV Neg/ HBsAg Neg   Results for orders placed or performed during the hospital encounter of 10/18/16  Comprehensive metabolic panel  Result Value Ref Range   Sodium 135 135 - 145 mmol/L   Potassium 3.3 (L) 3.5 - 5.1 mmol/L   Chloride 108 101 - 111 mmol/L   CO2 21 (L) 22 - 32 mmol/L   Glucose, Bld 92 65 - 99 mg/dL   BUN <5 (L) 6 - 20 mg/dL   Creatinine, Ser 1.610.54 0.44 - 1.00  mg/dL   Calcium 9.0 8.9 - 09.610.3 mg/dL   Total Protein 5.8 (L) 6.5 - 8.1 g/dL   Albumin 2.6 (L) 3.5 - 5.0 g/dL   AST 21 15 - 41 U/L   ALT 12 (L) 14 - 54 U/L   Alkaline Phosphatase 254 (H) 38 - 126 U/L   Total Bilirubin 0.6 0.3 - 1.2 mg/dL   GFR calc non Af Amer >60 >60 mL/min   GFR calc Af Amer >60 >60 mL/min   Anion gap 6 5 - 15  CBC  Result Value Ref Range   WBC 14.0 (H) 3.6 - 11.0 K/uL   RBC 3.19 (L) 3.80 - 5.20 MIL/uL   Hemoglobin 9.8 (L) 12.0 - 16.0 g/dL   HCT 04.528.6 (L) 40.935.0 - 81.147.0 %   MCV 89.6 80.0 - 100.0 fL   MCH 30.7 26.0 - 34.0 pg   MCHC 34.3 32.0 - 36.0 g/dL   RDW 91.412.9 78.211.5 - 95.614.5 %   Platelets 224 150 - 440 K/uL  Protein / creatinine ratio, urine  Result Value Ref Range   Creatinine, Urine 33 mg/dL   Total Protein, Urine 6 mg/dL   Protein Creatinine Ratio 0.18 (H) 0.00 - 0.15 mg/mg[Cre]    Assessment & Plan:   21 y.o. G1P0 @ 5925w6d, Admitted on 10/18/2016: Gestational Hypertension; no s/sx preeclampsia.  Close to term    Plan: Admit for labor, Observe for cervical change, Fetal Wellbeing Reassuring, Epidural when ready and AROM when Appropriate Cytotec then Pitocin Allow to eat first Labor discussed, could be long or short, expectations addressed Pros and cons of exp mgt vs active mgt of labor with hypertension without preeclampsia discussed  Annamarie MajorPaul Hessie Varone, MD, Merlinda FrederickFACOG [facility], [facility] 10/18/2016  9:07 PM

## 2016-10-19 ENCOUNTER — Encounter: Payer: Self-pay | Admitting: *Deleted

## 2016-10-19 ENCOUNTER — Inpatient Hospital Stay: Payer: Medicaid Other | Admitting: Certified Registered Nurse Anesthetist

## 2016-10-19 DIAGNOSIS — O134 Gestational [pregnancy-induced] hypertension without significant proteinuria, complicating childbirth: Secondary | ICD-10-CM

## 2016-10-19 DIAGNOSIS — Z3A38 38 weeks gestation of pregnancy: Secondary | ICD-10-CM

## 2016-10-19 LAB — TYPE AND SCREEN
ABO/RH(D): A POS
ANTIBODY SCREEN: NEGATIVE

## 2016-10-19 MED ORDER — SODIUM CHLORIDE 0.9 % IJ SOLN
INTRAMUSCULAR | Status: AC
  Filled 2016-10-19: qty 50

## 2016-10-19 MED ORDER — FENTANYL CITRATE (PF) 100 MCG/2ML IJ SOLN
INTRAMUSCULAR | Status: DC | PRN
  Administered 2016-10-19: 100 ug via INTRAVENOUS

## 2016-10-19 MED ORDER — MISOPROSTOL 200 MCG PO TABS
ORAL_TABLET | ORAL | Status: AC
  Filled 2016-10-19: qty 4

## 2016-10-19 MED ORDER — LIDOCAINE HCL (PF) 1 % IJ SOLN
INTRAMUSCULAR | Status: AC
  Filled 2016-10-19: qty 30

## 2016-10-19 MED ORDER — FENTANYL 2.5 MCG/ML W/ROPIVACAINE 0.2% IN NS 100 ML EPIDURAL INFUSION (ARMC-ANES)
EPIDURAL | Status: DC | PRN
  Administered 2016-10-19: 10 mL/h via EPIDURAL

## 2016-10-19 MED ORDER — FENTANYL CITRATE (PF) 100 MCG/2ML IJ SOLN
INTRAMUSCULAR | Status: AC
  Filled 2016-10-19: qty 2

## 2016-10-19 MED ORDER — LIDOCAINE HCL (PF) 1 % IJ SOLN
INTRAMUSCULAR | Status: DC | PRN
  Administered 2016-10-19: 3 mL

## 2016-10-19 MED ORDER — FENTANYL 2.5 MCG/ML W/ROPIVACAINE 0.2% IN NS 100 ML EPIDURAL INFUSION (ARMC-ANES)
EPIDURAL | Status: AC
  Filled 2016-10-19: qty 100

## 2016-10-19 MED ORDER — BUPIVACAINE HCL (PF) 0.25 % IJ SOLN
INTRAMUSCULAR | Status: DC | PRN
  Administered 2016-10-19 (×2): 5 mL via EPIDURAL

## 2016-10-19 MED ORDER — AMMONIA AROMATIC IN INHA
RESPIRATORY_TRACT | Status: AC
  Filled 2016-10-19: qty 10

## 2016-10-19 MED ORDER — OXYTOCIN 10 UNIT/ML IJ SOLN
INTRAMUSCULAR | Status: AC
  Filled 2016-10-19: qty 2

## 2016-10-19 MED ORDER — LIDOCAINE-EPINEPHRINE (PF) 1.5 %-1:200000 IJ SOLN
INTRAMUSCULAR | Status: DC | PRN
  Administered 2016-10-19: 3 mL via EPIDURAL

## 2016-10-19 MED ORDER — OXYTOCIN 40 UNITS IN LACTATED RINGERS INFUSION - SIMPLE MED
INTRAVENOUS | Status: AC
  Filled 2016-10-19: qty 1000

## 2016-10-19 NOTE — Anesthesia Preprocedure Evaluation (Signed)
Anesthesia Evaluation  Patient identified by MRN, date of birth, ID band Patient awake    Reviewed: Allergy & Precautions, H&P , NPO status , Patient's Chart, lab work & pertinent test results  Airway Mallampati: III  TM Distance: >3 FB Neck ROM: full    Dental  (+) Poor Dentition   Pulmonary neg pulmonary ROS, asthma ,    Pulmonary exam normal breath sounds clear to auscultation       Cardiovascular Exercise Tolerance: Good hypertension, Normal cardiovascular exam Rhythm:regular Rate:Normal     Neuro/Psych    GI/Hepatic negative GI ROS,   Endo/Other  Hypothyroidism   Renal/GU   negative genitourinary   Musculoskeletal   Abdominal   Peds  Hematology negative hematology ROS (+)   Anesthesia Other Findings Past Medical History: No date: Abdominal pain No date: Allergy No date: Asthma No date: Irregular menstrual cycle No date: Ovarian cyst  Past Surgical History: No date: OVARIAN CYST REMOVAL  BMI    Body Mass Index:  32.19 kg/m      Reproductive/Obstetrics (+) Pregnancy                             Anesthesia Physical Anesthesia Plan  ASA: III  Anesthesia Plan: Epidural   Post-op Pain Management:    Induction:   Airway Management Planned:   Additional Equipment:   Intra-op Plan:   Post-operative Plan:   Informed Consent: I have reviewed the patients History and Physical, chart, labs and discussed the procedure including the risks, benefits and alternatives for the proposed anesthesia with the patient or authorized representative who has indicated his/her understanding and acceptance.     Plan Discussed with: Anesthesiologist  Anesthesia Plan Comments:         Anesthesia Quick Evaluation

## 2016-10-19 NOTE — Progress Notes (Signed)
Subjective:  Painfully contracting  Objective:   Vitals: Blood pressure (!) 143/96, pulse 70, temperature 98.6 F (37 C), temperature source Oral, resp. rate 16, height 5\' 2"  (1.575 m), weight 176 lb (79.8 kg), last menstrual period 12/20/2015. General: NAD Abdomen: gravid, non-tender Cervical Exam:  Dilation: 3.5 Effacement (%): 60 Cervical Position: Posterior Station: -3 Presentation: Vertex Exam by:: Forest Redwine Foley bulb expelled, AROM clear fluid  FHT: 130, moderate, +accels, no decels Toco: q1-62min  Results for orders placed or performed during the hospital encounter of 10/18/16 (from the past 24 hour(s))  Protein / creatinine ratio, urine     Status: Abnormal   Collection Time: 10/18/16  5:49 PM  Result Value Ref Range   Creatinine, Urine 33 mg/dL   Total Protein, Urine 6 mg/dL   Protein Creatinine Ratio 0.18 (H) 0.00 - 0.15 mg/mg[Cre]  Comprehensive metabolic panel     Status: Abnormal   Collection Time: 10/18/16  5:59 PM  Result Value Ref Range   Sodium 135 135 - 145 mmol/L   Potassium 3.3 (L) 3.5 - 5.1 mmol/L   Chloride 108 101 - 111 mmol/L   CO2 21 (L) 22 - 32 mmol/L   Glucose, Bld 92 65 - 99 mg/dL   BUN <5 (L) 6 - 20 mg/dL   Creatinine, Ser 1.610.54 0.44 - 1.00 mg/dL   Calcium 9.0 8.9 - 09.610.3 mg/dL   Total Protein 5.8 (L) 6.5 - 8.1 g/dL   Albumin 2.6 (L) 3.5 - 5.0 g/dL   AST 21 15 - 41 U/L   ALT 12 (L) 14 - 54 U/L   Alkaline Phosphatase 254 (H) 38 - 126 U/L   Total Bilirubin 0.6 0.3 - 1.2 mg/dL   GFR calc non Af Amer >60 >60 mL/min   GFR calc Af Amer >60 >60 mL/min   Anion gap 6 5 - 15  CBC     Status: Abnormal   Collection Time: 10/18/16  5:59 PM  Result Value Ref Range   WBC 14.0 (H) 3.6 - 11.0 K/uL   RBC 3.19 (L) 3.80 - 5.20 MIL/uL   Hemoglobin 9.8 (L) 12.0 - 16.0 g/dL   HCT 04.528.6 (L) 40.935.0 - 81.147.0 %   MCV 89.6 80.0 - 100.0 fL   MCH 30.7 26.0 - 34.0 pg   MCHC 34.3 32.0 - 36.0 g/dL   RDW 91.412.9 78.211.5 - 95.614.5 %   Platelets 224 150 - 440 K/uL  Type and screen  [facility]     Status: None   Collection Time: 10/18/16 10:01 PM  Result Value Ref Range   ABO/RH(D) A POS    Antibody Screen NEG    Sample Expiration 10/21/2016     Assessment:   21 y.o. G1P0 2357w0d IOL GHTN  Plan:   1) Labor - s/p cytotec and foley bulb, AROM clear this check  2) Fetus - cat I tracing

## 2016-10-19 NOTE — Progress Notes (Signed)
  Labor Progress Note   21 y.o. G1P0 @ 1120w0d , admitted for  Pregnancy, Labor Management. GEST HTN  Subjective:  BPs under control during night better than first few hours of admission Denies sx's Diet and Ambien w sleep last night  Objective:  BP 127/81   Pulse 67   Temp 97.6 F (36.4 C) (Axillary)   Resp 18   Ht 5\' 2"  (1.575 m)   Wt 176 lb (79.8 kg)   LMP 12/20/2015   BMI 32.19 kg/m  Abd: mild Extr: trace to 1+ bilateral pedal edema  Assessment & Plan:  G1P0 @ 8620w0d, admitted for  Pregnancy and Labor/Delivery Management  1. Pain management: none. 2. FWB: FHT category 1.  3. ID: GBS negative 4. Labor management: Cytotec first dose 5. HTN  Cont to monitor BPs and for s/sx preeclampsia  All discussed with patient, see orders  Annamarie MajorPaul Junah Yam, MD, Merlinda FrederickFACOG [facility], [facility] 10/19/2016  7:22 AM

## 2016-10-19 NOTE — Discharge Summary (Signed)
Obstetric Discharge Summary Reason for Admission: induction of labor and GHTN Prenatal Procedures: none Intrapartum Procedures: spontaneous vaginal delivery Postpartum Procedures: none Complications-Operative and Postpartum: Mild range BP's peristant postpartum requiring nifedipine with improvement after starting nifedipine Hemoglobin  Date Value Ref Range Status  10/20/2016 8.6 (L) 12.0 - 16.0 g/dL Final  16/10/960409/27/2017 54.014.6 g/dL Final   HCT  Date Value Ref Range Status  10/20/2016 25.1 (L) 35.0 - 47.0 % Final  02/17/2016 42 % Final   Hematocrit  Date Value Ref Range Status  08/03/2016 29.6 (L) 34.0 - 46.6 % Final    Physical Exam:  General: alert, appears stated age and no distress Lochia: appropriate Uterine Fundus: firm DVT Evaluation: +2 pitting pretibial edema, no erythema, no tenderness, negative homans' sign  Discharge Diagnoses: Term Pregnancy-delivered  Discharge Information: Date: 10/22/2016 Activity: pelvic rest Diet: routine Allergies as of 10/22/2016      Reactions   Penicillins       Medication List    STOP taking these medications   potassium chloride SA 20 MEQ tablet Commonly known as:  K-DUR,KLOR-CON     TAKE these medications   ferrous sulfate 160 (50 Fe) MG Tbcr SR tablet Commonly known as:  EQL SLOW RELEASE IRON Take 1 tablet (160 mg total) by mouth daily.   ibuprofen 600 MG tablet Commonly known as:  ADVIL,MOTRIN Take 1 tablet (600 mg total) by mouth every 6 (six) hours.   NIFEdipine 30 MG 24 hr tablet Commonly known as:  PROCARDIA-XL/ADALAT CC Take 1 tablet (30 mg total) by mouth daily.   prenatal multivitamin Tabs tablet Take 1 tablet by mouth daily at 12 noon.       Condition: stable Discharge to: home Follow-up Information    Sherry Nunez, Sherry Tassinari, Sherry Nunez. Schedule an appointment as soon as possible for a visit in 6 week(s).   Specialty:  Obstetrics and Gynecology Why:  Please call to make your 6 week postpartum follow up appointment   Contact information: [address] [place] [address] [phone]        Sherry Nunez, Sherry Nunez, Sherry Nunez. Go on 10/24/2016.   Specialty:  Obstetrics and Gynecology Why:  Follow up appointment on Monday June 4th at 9:20 for a blood pressure check Contact information: [address] [place] [address] [phone]           Newborn Data: Live born unspecified sex  Birth Weight: 7 lb 6.9 oz (3370 g) APGAR: 7, 9  Home with mother.  Sherry Austriandreas Zackari Ruane 10/22/2016, 8:25 AM

## 2016-10-19 NOTE — Progress Notes (Signed)
Subjective:  Continues to complain of left sided abdominal pain with contractions  Objective:   Vitals: Blood pressure (!) 143/82, pulse 82, temperature 98.8 F (37.1 C), temperature source Oral, resp. rate 18, height 5\' 2"  (1.575 m), weight 176 lb (79.8 kg), last menstrual period 12/20/2015, SpO2 99 %. General: Uncomfortable with contractions despite epidural Abdomen: gravid, non-tender Cervical Exam:  9/C/0 FSE placed  FHT: 120, moderate, +accels with fetal scalp stim, occasional earlies Toco: q503min   Results for orders placed or performed during the hospital encounter of 10/18/16 (from the past 24 hour(s))  Type and screen [facility]     Status: None   Collection Time: 10/18/16 10:01 PM  Result Value Ref Range   ABO/RH(D) A POS    Antibody Screen NEG    Sample Expiration 10/21/2016     Assessment:   21 y.o. G1P0 6024w0d IOL GHTN  Plan:   1) Labor - progressing, never had pitocin started.  Status post cytotec and foley  2) Fetus - cat I tracing  3) GHTN - mild range BP's

## 2016-10-19 NOTE — Progress Notes (Signed)
Subjective:  Comfortable  Objective:   Vitals: Blood pressure 123/87, pulse 99, temperature 97.9 F (36.6 C), temperature source Oral, resp. rate 18, height 5\' 2"  (1.575 m), weight 176 lb (79.8 kg), last menstrual period 12/20/2015, SpO2 99 %. General: NAD Abdomen: gravid, non-tender Cervical Exam:  Dilation: Lip/rim Effacement (%): 90 Cervical Position: Posterior Station: 0 Presentation: Vertex Exam by:: Delice LeschA. Adamary Savary, MD  Rim reducible   FHT: 120 with brief change of baseline to 100, moderate, accels present, earlies Toco:q265min  Results for orders placed or performed during the hospital encounter of 10/18/16 (from the past 24 hour(s))  Type and screen [facility]     Status: None   Collection Time: 10/18/16 10:01 PM  Result Value Ref Range   ABO/RH(D) A POS    Antibody Screen NEG    Sample Expiration 10/21/2016     Assessment:   21 y.o. G1P0 866w0d IOL GHTN  Plan:   1) Labor - will start pushing, rim on left appears reducible  2) Fetus - cat I  3) GHTN - normotensive to mild range

## 2016-10-19 NOTE — Progress Notes (Signed)
Subjective:  Comfortable with epdidural  Objective:   Vitals: Blood pressure (!) 149/84, pulse 72, temperature 98.1 F (36.7 C), temperature source Oral, resp. rate 16, height 5\' 2"  (1.575 m), weight 176 lb (79.8 kg), last menstrual period 12/20/2015, SpO2 100 %. General: NAD Abdomen: gravid, non-tender Cervical Exam: 5/90/0 FHT: 120, moderate, no accels, occasional early Toco: q2-223min  Results for orders placed or performed during the hospital encounter of 10/18/16 (from the past 24 hour(s))  Protein / creatinine ratio, urine     Status: Abnormal   Collection Time: 10/18/16  5:49 PM  Result Value Ref Range   Creatinine, Urine 33 mg/dL   Total Protein, Urine 6 mg/dL   Protein Creatinine Ratio 0.18 (H) 0.00 - 0.15 mg/mg[Cre]  Comprehensive metabolic panel     Status: Abnormal   Collection Time: 10/18/16  5:59 PM  Result Value Ref Range   Sodium 135 135 - 145 mmol/L   Potassium 3.3 (L) 3.5 - 5.1 mmol/L   Chloride 108 101 - 111 mmol/L   CO2 21 (L) 22 - 32 mmol/L   Glucose, Bld 92 65 - 99 mg/dL   BUN <5 (L) 6 - 20 mg/dL   Creatinine, Ser 2.950.54 0.44 - 1.00 mg/dL   Calcium 9.0 8.9 - 62.110.3 mg/dL   Total Protein 5.8 (L) 6.5 - 8.1 g/dL   Albumin 2.6 (L) 3.5 - 5.0 g/dL   AST 21 15 - 41 U/L   ALT 12 (L) 14 - 54 U/L   Alkaline Phosphatase 254 (H) 38 - 126 U/L   Total Bilirubin 0.6 0.3 - 1.2 mg/dL   GFR calc non Af Amer >60 >60 mL/min   GFR calc Af Amer >60 >60 mL/min   Anion gap 6 5 - 15  CBC     Status: Abnormal   Collection Time: 10/18/16  5:59 PM  Result Value Ref Range   WBC 14.0 (H) 3.6 - 11.0 K/uL   RBC 3.19 (L) 3.80 - 5.20 MIL/uL   Hemoglobin 9.8 (L) 12.0 - 16.0 g/dL   HCT 30.828.6 (L) 65.735.0 - 84.647.0 %   MCV 89.6 80.0 - 100.0 fL   MCH 30.7 26.0 - 34.0 pg   MCHC 34.3 32.0 - 36.0 g/dL   RDW 96.212.9 95.211.5 - 84.114.5 %   Platelets 224 150 - 440 K/uL  Type and screen [facility]     Status: None   Collection Time: 10/18/16 10:01 PM  Result Value Ref Range   ABO/RH(D)  A POS    Antibody Screen NEG    Sample Expiration 10/21/2016     Assessment:   21 y.o. G1P0 28106w0d IOL GHTN  Plan:   1) Labor - s/p cytotec, foley progressing  2) Fetus - cat I tracing  3) GHTN - BP remains mild range

## 2016-10-19 NOTE — Progress Notes (Signed)
Obstetric and Gynecology  Subjective  Worsening left lower quadrant pain  Objective   Vitals:   10/19/16 0834 10/19/16 0837  BP:  (!) 148/98  Pulse:  71  Resp:    Temp: 97.9 F (36.6 C)     No intake or output data in the 24 hours ending 10/19/16 0900  FHT: 140, moderate, +accels, no decels  Toco: q1-62min  General: Painfully contracting Abdomen: gravid, non-tender Cervix: 2/50/-3 Extremities: no edema  Labs: Results for orders placed or performed during the hospital encounter of 10/18/16 (from the past 24 hour(s))  Protein / creatinine ratio, urine     Status: Abnormal   Collection Time: 10/18/16  5:49 PM  Result Value Ref Range   Creatinine, Urine 33 mg/dL   Total Protein, Urine 6 mg/dL   Protein Creatinine Ratio 0.18 (H) 0.00 - 0.15 mg/mg[Cre]  Comprehensive metabolic panel     Status: Abnormal   Collection Time: 10/18/16  5:59 PM  Result Value Ref Range   Sodium 135 135 - 145 mmol/L   Potassium 3.3 (L) 3.5 - 5.1 mmol/L   Chloride 108 101 - 111 mmol/L   CO2 21 (L) 22 - 32 mmol/L   Glucose, Bld 92 65 - 99 mg/dL   BUN <5 (L) 6 - 20 mg/dL   Creatinine, Ser 1.61 0.44 - 1.00 mg/dL   Calcium 9.0 8.9 - 09.6 mg/dL   Total Protein 5.8 (L) 6.5 - 8.1 g/dL   Albumin 2.6 (L) 3.5 - 5.0 g/dL   AST 21 15 - 41 U/L   ALT 12 (L) 14 - 54 U/L   Alkaline Phosphatase 254 (H) 38 - 126 U/L   Total Bilirubin 0.6 0.3 - 1.2 mg/dL   GFR calc non Af Amer >60 >60 mL/min   GFR calc Af Amer >60 >60 mL/min   Anion gap 6 5 - 15  CBC     Status: Abnormal   Collection Time: 10/18/16  5:59 PM  Result Value Ref Range   WBC 14.0 (H) 3.6 - 11.0 K/uL   RBC 3.19 (L) 3.80 - 5.20 MIL/uL   Hemoglobin 9.8 (L) 12.0 - 16.0 g/dL   HCT 04.5 (L) 40.9 - 81.1 %   MCV 89.6 80.0 - 100.0 fL   MCH 30.7 26.0 - 34.0 pg   MCHC 34.3 32.0 - 36.0 g/dL   RDW 91.4 78.2 - 95.6 %   Platelets 224 150 - 440 K/uL  Type and screen [facility]     Status: None   Collection Time: 10/18/16 10:01 PM   Result Value Ref Range   ABO/RH(D) A POS    Antibody Screen NEG    Sample Expiration 10/21/2016     Cultures: Results for orders placed or performed in visit on 09/27/16  Strep Gp B NAA     Status: None   Collection Time: 09/27/16  4:01 PM  Result Value Ref Range Status   Strep Gp B NAA Negative Negative Final    Comment: Centers for Disease Control and Prevention (CDC) and American Congress of Obstetricians and Gynecologists (ACOG) guidelines for prevention of perinatal group B streptococcal (GBS) disease specify co-collection of a vaginal and rectal swab specimen to maximize sensitivity of GBS detection. Per the CDC and ACOG, swabbing both the lower vagina and rectum substantially increases the yield of detection compared with sampling the vagina alone. Penicillin G, ampicillin, or cefazolin are indicated for intrapartum prophylaxis of perinatal GBS colonization. Reflex susceptibility testing should be performed prior  to use of clindamycin only on GBS isolates from penicillin-allergic women who are considered a high risk for anaphylaxis. Treatment with vancomycin without additional testing is warranted if resistance to clindamycin is noted.   GC/Chlamydia Probe Amp     Status: None   Collection Time: 09/27/16  4:04 PM  Result Value Ref Range Status   Chlamydia trachomatis, NAA Negative Negative Final   Neisseria gonorrhoeae by PCR Negative Negative Final    Imaging:  Assessment   21 y.o. G1P0 at 322w0d IOL for GHTN  Plan   1) GHTN - normotensive to mild range BP's  2) IOL - s/p cytotec, foley catheter placed this check  3) A pos / RI / VZI / GBS neg  4) Breast / OCP  5) Disposition pending delivery

## 2016-10-19 NOTE — Anesthesia Procedure Notes (Signed)
Epidural Patient location during procedure: OB Start time: 10/19/2016 1:53 PM End time: 10/19/2016 2:19 PM  Staffing Anesthesiologist: Naomie DeanKEPHART, WILLIAM K Resident/CRNA: Malva CoganBEANE, Rabab Currington Performed: resident/CRNA   Preanesthetic Checklist Completed: patient identified, site marked, surgical consent, pre-op evaluation, timeout performed, IV checked, risks and benefits discussed and monitors and equipment checked  Epidural Patient position: sitting Prep: Betadine Patient monitoring: heart rate, continuous pulse ox and blood pressure Approach: midline Location: L4-L5 Injection technique: LOR saline  Needle:  Needle type: Tuohy  Needle gauge: 17 G Needle length: 9 cm and 9 Needle insertion depth: 5 cm Catheter type: closed end flexible Catheter size: 19 Gauge Catheter at skin depth: 11 cm Test dose: negative and 1.5% lidocaine with Epi 1:200 K  Assessment Sensory level: T10 Events: blood not aspirated, injection not painful, no injection resistance, negative IV test and no paresthesia  Additional Notes Pt. Evaluated and documentation done after procedure finished. Patient identified. Risks/Benefits/Options discussed with patient including but not limited to bleeding, infection, nerve damage, paralysis, failed block, incomplete pain control, headache, blood pressure changes, nausea, vomiting, reactions to medication both or allergic, itching and postpartum back pain. Confirmed with bedside nurse the patient's most recent platelet count. Confirmed with patient that they are not currently taking any anticoagulation, have any bleeding history or any family history of bleeding disorders. Patient expressed understanding and wished to proceed. All questions were answered. Sterile technique was used throughout the entire procedure. Please see nursing notes for vital signs. Test dose was given through epidural catheter and negative prior to continuing to dose epidural or start infusion. Warning  signs of high block given to the patient including shortness of breath, tingling/numbness in hands, complete motor block, or any concerning symptoms with instructions to call for help. Patient was given instructions on fall risk and not to get out of bed. All questions and concerns addressed with instructions to call with any issues or inadequate analgesia.   Patient tolerated the insertion well without immediate complications.Reason for block:procedure for pain

## 2016-10-20 LAB — CBC
HEMATOCRIT: 25.1 % — AB (ref 35.0–47.0)
HEMOGLOBIN: 8.6 g/dL — AB (ref 12.0–16.0)
MCH: 30.5 pg (ref 26.0–34.0)
MCHC: 34.2 g/dL (ref 32.0–36.0)
MCV: 89.1 fL (ref 80.0–100.0)
Platelets: 222 10*3/uL (ref 150–440)
RBC: 2.82 MIL/uL — ABNORMAL LOW (ref 3.80–5.20)
RDW: 13.2 % (ref 11.5–14.5)
WBC: 25.2 10*3/uL — ABNORMAL HIGH (ref 3.6–11.0)

## 2016-10-20 LAB — RPR: RPR Ser Ql: NONREACTIVE

## 2016-10-20 MED ORDER — ACETAMINOPHEN 325 MG PO TABS: 650.0000 mg | ORAL_TABLET | ORAL | Status: DC | PRN

## 2016-10-20 MED ORDER — OXYCODONE-ACETAMINOPHEN 5-325 MG PO TABS: 2.0000 | ORAL_TABLET | ORAL | Status: DC | PRN

## 2016-10-20 MED ORDER — SIMETHICONE 80 MG PO CHEW
80.0000 mg | CHEWABLE_TABLET | ORAL | Status: DC | PRN
Start: 2016-10-20 — End: 2016-10-22

## 2016-10-20 MED ORDER — ONDANSETRON HCL 4 MG PO TABS: 4.0000 mg | ORAL_TABLET | ORAL | Status: DC | PRN

## 2016-10-20 MED ORDER — WITCH HAZEL-GLYCERIN EX PADS: 1.0000 "application " | MEDICATED_PAD | CUTANEOUS | Status: DC | PRN

## 2016-10-20 MED ORDER — IBUPROFEN 600 MG PO TABS
600.0000 mg | ORAL_TABLET | Freq: Four times a day (QID) | ORAL | Status: DC
  Filled 2016-10-20 (×2): qty 1

## 2016-10-20 MED ORDER — DIPHENHYDRAMINE HCL 25 MG PO CAPS: 25.0000 mg | ORAL_CAPSULE | Freq: Four times a day (QID) | ORAL | Status: DC | PRN

## 2016-10-20 MED ORDER — SENNOSIDES-DOCUSATE SODIUM 8.6-50 MG PO TABS: 2.0000 | ORAL_TABLET | ORAL | Status: DC

## 2016-10-20 MED ORDER — COCONUT OIL OIL
1.0000 "application " | TOPICAL_OIL | Status: DC | PRN
  Administered 2016-10-20: 1 via TOPICAL
  Filled 2016-10-20: qty 120

## 2016-10-20 MED ORDER — ONDANSETRON HCL 4 MG/2ML IJ SOLN: 4.0000 mg | INTRAMUSCULAR | Status: DC | PRN

## 2016-10-20 MED ORDER — DIBUCAINE 1 % RE OINT: 1.0000 "application " | TOPICAL_OINTMENT | RECTAL | Status: DC | PRN

## 2016-10-20 MED ORDER — PRENATAL MULTIVITAMIN CH
1.0000 | ORAL_TABLET | Freq: Every day | ORAL | Status: DC
  Filled 2016-10-20: qty 1

## 2016-10-20 MED ORDER — TETANUS-DIPHTH-ACELL PERTUSSIS 5-2.5-18.5 LF-MCG/0.5 IM SUSP: 0.5000 mL | Freq: Once | INTRAMUSCULAR | Status: DC

## 2016-10-20 MED ORDER — IBUPROFEN 600 MG PO TABS
ORAL_TABLET | ORAL | Status: AC
  Filled 2016-10-20: qty 1

## 2016-10-20 MED ORDER — BENZOCAINE-MENTHOL 20-0.5 % EX AERO
1.0000 "application " | INHALATION_SPRAY | CUTANEOUS | Status: DC | PRN
  Administered 2016-10-20: 1 via TOPICAL
  Filled 2016-10-20: qty 56

## 2016-10-20 MED ORDER — OXYCODONE-ACETAMINOPHEN 5-325 MG PO TABS
1.0000 | ORAL_TABLET | ORAL | Status: DC | PRN
  Administered 2016-10-21: 0.5 via ORAL
  Filled 2016-10-20: qty 1

## 2016-10-20 NOTE — Progress Notes (Signed)
Patient ID: Sherry Nunez, female   DOB: Jun 12, 1995, 21 y.o.   MRN: [id number] Obstetric Postpartum Daily Progress Note Subjective:  21 y.o. G1P1001 postpartum day #1 status post vaginal delivery.  She is ambulating, is tolerating po, is voiding spontaneously.  Her pain is well controlled on PO pain medications. Her lochia is less than menses. Denies HA, visual changes, and RUQ pain.    Medications SCHEDULED MEDICATIONS  . ibuprofen  600 mg Oral Q6H  . prenatal multivitamin  1 tablet Oral Q1200  . senna-docusate  2 tablet Oral Q24H  . Tdap  0.5 mL Intramuscular Once    MEDICATION INFUSIONS    PRN MEDICATIONS  acetaminophen, benzocaine-Menthol, coconut oil, witch hazel-glycerin **AND** dibucaine, diphenhydrAMINE, ondansetron **OR** ondansetron (ZOFRAN) IV, oxyCODONE-acetaminophen, oxyCODONE-acetaminophen, simethicone    Objective:   Vitals:   10/20/16 0100 10/20/16 0500 10/20/16 0747 10/20/16 1202  BP: (!) 141/85 136/77 (!) 146/82 134/85  Pulse: (!) 104 92 (!) 111 (!) 104  Resp: 20 18 18 20   Temp: 99.3 F (37.4 C) 98.5 F (36.9 C) 98.1 F (36.7 C) 98.3 F (36.8 C)  TempSrc: Oral Oral Oral Oral  SpO2: 98%  97% 98%  Weight:      Height:        Current Vital Signs 24h Vital Sign Ranges  T 98.3 F (36.8 C) Temp  Avg: 98.5 F (36.9 C)  Min: 97.9 F (36.6 C)  Max: 99.3 F (37.4 C)  BP 134/85 BP  Min: 123/87  Max: 153/78  HR (!) 104 Pulse  Avg: 96  Min: 65  Max: 137  RR 20 Resp  Avg: 17.6  Min: 16  Max: 20  SaO2 98 % Not Delivered SpO2  Avg: 99.5 %  Min: 97 %  Max: 100 %       24 Hour I/O Current Shift I/O  Time Ins Outs 05/30 0701 - 05/31 0700 In: 1888.3 [P.O.:720; I.V.:1168.3] Out: 2100 [Urine:1600] 05/31 0701 - 05/31 1900 In: 120 [P.O.:120] Out: -   General: NAD Pulmonary: no increased work of breathing Abdomen: non-distended, non-tender, fundus firm at level of umbilicus Extremities: no edema, no erythema, no tenderness  Labs:   Recent Labs Lab 10/14/16 0255  10/18/16 1759 10/20/16 0532  WBC 14.0* 14.0* 25.2*  HGB 9.4* 9.8* 8.6*  HCT 26.9* 28.6* 25.1*  PLT 192 224 222    Assessment:   21 y.o. G1P1001 postpartum day # 1 status post SVD, doing well.  GHTN. Mild-range blood pressure  Plan:   1) Acute blood loss anemia - hemodynamically stable and asymptomatic - po ferrous sulfate  2) A POS / Rubella Immune (09/27 0000)/ Varicella Immune  3) TDAP status up to date  4) formula feeding - still considering breast feeding /Contraception = still deciding. Likely ocps  5) Disposition: home tomorrow, pending BPs  Thomasene MohairStephen Jackson, MD 10/20/2016 12:34 PM

## 2016-10-20 NOTE — Anesthesia Postprocedure Evaluation (Signed)
Anesthesia Post Note  Patient: Sherry Nunez  Procedure(s) Performed: * No procedures listed *  Patient location during evaluation: Mother Baby Anesthesia Type: Epidural Level of consciousness: awake, awake and alert and oriented Pain management: pain level controlled Vital Signs Assessment: post-procedure vital signs reviewed and stable Respiratory status: spontaneous breathing and nonlabored ventilation Cardiovascular status: stable Postop Assessment: no backache, epidural receding and adequate PO intake Anesthetic complications: no     Last Vitals:  Vitals:   10/20/16 0100 10/20/16 0500  BP: (!) 141/85 136/77  Pulse: (!) 104 92  Resp: 20 18  Temp: 37.4 C 36.9 C    Last Pain:  Vitals:   10/20/16 0500  TempSrc: Oral  PainSc:                  Zachary GeorgeWeatherly,  Suheyla Mortellaro F

## 2016-10-20 NOTE — Lactation Note (Signed)
This note was copied from a baby's chart. Lactation Consultation Note  Patient Name: Sherry Nunez YNWGN'FToday's Date: 10/20/2016 Reason for consult: Initial assessment   Maternal Data Formula Feeding for Exclusion: No Does the patient have breastfeeding experience prior to this delivery?: Yes  Feeding    Mother was complaining of severe pain with breast feeding. She does have inverted nipples. They are cracked and bleeding. Sh had been given a shield in the night. For now I want her to pump a few times over night then attempt breast feeding with the lactation consultant tomorrow.  I have reviewed how to use the pump. I have instructed her how to use the pump for later, in the initiation mode and how to clean the tubing as per the manufacturers recommendations.   She has been given coconut oil for her nipples.                       Consult Status   On going and prn   Trudee GripCarolyn P Mahnoor Mathisen 10/20/2016, 11:19 AM

## 2016-10-21 MED ORDER — NIFEDIPINE ER OSMOTIC RELEASE 30 MG PO TB24
30.0000 mg | ORAL_TABLET | Freq: Every day | ORAL | Status: DC
  Administered 2016-10-21 – 2016-10-22 (×2): 30 mg via ORAL
  Filled 2016-10-21 (×2): qty 1

## 2016-10-21 MED ORDER — FERROUS SULFATE 325 (65 FE) MG PO TABS
325.0000 mg | ORAL_TABLET | Freq: Two times a day (BID) | ORAL | Status: DC
  Administered 2016-10-21 – 2016-10-22 (×2): 325 mg via ORAL
  Filled 2016-10-21 (×2): qty 1

## 2016-10-21 NOTE — Progress Notes (Signed)
Post Partum Day 2 (48 hours tonight at 2230) Subjective: up ad lib, voiding and tolerating PO. Denies lightheadedness when OOB. Denies headaches/ visual changes/ nausea  Objective: Blood pressure (!) 138/95, pulse 82, temperature 97.7 F (36.5 C), temperature source Oral, resp. rate 20, height 5\' 2"  (1.575 m), weight 79.8 kg (176 lb), last menstrual period 12/20/2015, SpO2 99 %, unknown if currently breastfeeding. BP range 134-156/86-95 Physical  Exam:  General: alert, cooperative and no distress Lochia: appropriate Uterine Fundus: firm/ at U/ ML/ NT  DVT Evaluation: +2 non pitting edema in lower extremities. No evidence of DVT.   Recent Labs  10/18/16 1759 10/20/16 0532  HGB 9.8* 8.6*  HCT 28.6* 25.1*  WBC 14.0* 25.2*  PLT 224 222    Assessment/Plan: PPD #2 s/p SVD Gestational hypertension with blood pressures in the mild range Anemia postpartum P: Start Procardia 30 mgm XL daily-will not discharge till tomorrow and monitor blood pressures on Procardia Iron and vitamins Routine postpartum care. 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 Patient has never been tested for this. Will discuss with MD whether to place on Baby ASA daily.  Farrel Connersolleen Kevante Lunt, CNM     LOS: 3 days   Farrel ConnersColleen Ermon Sagan 10/21/2016, 1:33 PM

## 2016-10-22 MED ORDER — NIFEDIPINE ER 30 MG PO TB24: 30.0000 mg | ORAL_TABLET | Freq: Every day | ORAL | 0 refills | Status: DC

## 2016-10-22 MED ORDER — IBUPROFEN 600 MG PO TABS: 600.0000 mg | ORAL_TABLET | Freq: Four times a day (QID) | ORAL | 0 refills | Status: DC

## 2016-10-22 NOTE — Progress Notes (Signed)
Discharge instructions complete and prescriptions given. Patient verbalizes understanding of teaching. Patient upset that RNs cannot place infant in carseat. Another RN explained to patient that this is against protocol. Patient refused wheelchair and walked to car with infant in her arms.  Patient discharged home at 1330.

## 2016-10-24 ENCOUNTER — Encounter: Payer: Medicaid Other | Admitting: Obstetrics & Gynecology

## 2016-10-24 ENCOUNTER — Ambulatory Visit (INDEPENDENT_AMBULATORY_CARE_PROVIDER_SITE_OTHER): Payer: Medicaid Other | Admitting: Obstetrics & Gynecology

## 2016-10-24 VITALS — BP 148/80

## 2016-10-24 DIAGNOSIS — O165 Unspecified maternal hypertension, complicating the puerperium: Secondary | ICD-10-CM | POA: Diagnosis not present

## 2016-10-24 DIAGNOSIS — O139 Gestational [pregnancy-induced] hypertension without significant proteinuria, unspecified trimester: Secondary | ICD-10-CM | POA: Diagnosis not present

## 2016-10-24 NOTE — Progress Notes (Signed)
  History of Present Illness:  Sherry Nunez is a 21 y.o. who was started on Procardia for gest and then PP HTN approximately 1 weeks ago. Since that time, she states that her symptoms are improving. Denies ha. Blurry vision, CP, SOB, epig pain.  Has improving but still pitting edema in LE.  PMHx: She  has a past medical history of Abdominal pain; Allergy; Asthma; Irregular menstrual cycle; and Ovarian cyst. Also,  has a past surgical history that includes Ovarian cyst removal., family history includes GER disease in her maternal uncle.,  reports that she has never smoked. She has never used smokeless tobacco. She reports that she drinks alcohol. She reports that she uses drugs, including Marijuana. No outpatient prescriptions have been marked as taking for the 10/24/16 encounter (Routine Prenatal) with Nadara MustardHarris, [place] Anagnos P, MD.  . Also, is allergic to penicillins..  Review of Systems  Constitutional: Negative for chills, fever and malaise/fatigue.  HENT: Negative for congestion, sinus pain and sore throat.   Eyes: Negative for blurred vision and pain.  Respiratory: Negative for cough and wheezing.   Cardiovascular: Negative for chest pain and leg swelling.  Gastrointestinal: Negative for abdominal pain, constipation, diarrhea, heartburn, nausea and vomiting.  Genitourinary: Negative for dysuria, frequency, hematuria and urgency.  Musculoskeletal: Negative for back pain, joint pain, myalgias and neck pain.  Skin: Negative for itching and rash.  Neurological: Negative for dizziness, tremors and weakness.  Endo/Heme/Allergies: Does not bruise/bleed easily.  Psychiatric/Behavioral: Negative for depression. The patient is not nervous/anxious and does not have insomnia.     Physical Exam:  BP (!) 148/80   LMP 12/20/2015  There is no height or weight on file to calculate BMI. Constitutional: Well nourished, well developed female in no acute distress.  Abdomen: diffusely non tender to palpation, non  distended, and no masses, hernias Neuro: Grossly intact Psych:  Normal mood and affect.   Extr- 2+ edema  Assessment:  Problem List Items Addressed This Visit    Postpartum hypertension   RESOLVED: Gestational hypertension - Primary  (no longer pregnant)     Medication treatment is going well for her hypertension.  Plan: She will undergo no change in her medical therapy. Plan to stop medicine after 6 weeks, or sooner if sx's of hypotension develop She was amenable to this plan and we will see her back for 6 week PP check.  Annamarie MajorPaul Xylon Croom, MD, Merlinda FrederickFACOG [facility], [facility] 10/24/2016  9:49 AM

## 2016-11-15 ENCOUNTER — Telehealth: Payer: Self-pay

## 2016-11-15 NOTE — Telephone Encounter (Signed)
Prob more pp related than from BP meds Rec twice daily stool softener (colace)

## 2016-11-15 NOTE — Telephone Encounter (Signed)
Pt thinks BP meds are causing constipation. She chews fiber gards(?) but still has a lot of blood in toilet and on tissue paper.  What to do? [phone]

## 2016-11-16 NOTE — Telephone Encounter (Signed)
Pt aware.

## 2016-11-18 ENCOUNTER — Other Ambulatory Visit: Payer: Self-pay | Admitting: Obstetrics and Gynecology

## 2016-11-18 NOTE — Telephone Encounter (Signed)
Please advise, PP visit is scheduled with AMS on 7/13. He is out of the office today and last was refilled by CLG

## 2016-12-02 ENCOUNTER — Encounter: Payer: Self-pay | Admitting: Obstetrics and Gynecology

## 2016-12-02 ENCOUNTER — Ambulatory Visit (INDEPENDENT_AMBULATORY_CARE_PROVIDER_SITE_OTHER): Payer: Medicaid Other | Admitting: Obstetrics and Gynecology

## 2016-12-02 DIAGNOSIS — Z124 Encounter for screening for malignant neoplasm of cervix: Secondary | ICD-10-CM

## 2016-12-02 MED ORDER — NORETHIN-ETH ESTRAD-FE BIPHAS 1 MG-10 MCG / 10 MCG PO TABS: 1.0000 | ORAL_TABLET | Freq: Every day | ORAL | 3 refills | Status: DC

## 2016-12-02 NOTE — Progress Notes (Signed)
Postpartum Visit  Chief Complaint:  Chief Complaint  Patient presents with  . Postpartum Care    History of Present Illness: Patient is a 21 y.o. G1P1001 presents for postpartum visit.  Date of delivery: 10/19/16 Type of delivery: Vaginal delivery - Vacuum or forceps assisted  no Episiotomy No.  Laceration: no  Pregnancy or labor problems:  Yes GHTN - on nifedipine XL 30mg  postpartum Any problems since the delivery:  no  Newborn Details:  SINGLETON :  1. Baby's name: Caleen Jobs. Birth weight: 7lbs 6.9oz Maternal Details:  Breast Feeding:  no Post partum depression/anxiety noted:  no [place] Post-Partum Depression Score:  1  Date of last PAP: none on record    Review of Systems: Review of Systems  Constitutional: Negative for chills and fever.  HENT: Negative for congestion.   Respiratory: Negative for cough and shortness of breath.   Cardiovascular: Negative for chest pain and palpitations.  Gastrointestinal: Negative for abdominal pain, constipation, diarrhea, heartburn, nausea and vomiting.  Genitourinary: Negative for dysuria, frequency and urgency.  Skin: Negative for itching and rash.  Neurological: Negative for dizziness and headaches.  Endo/Heme/Allergies: Negative for polydipsia.  Psychiatric/Behavioral: Negative for depression.    Past Medical History:  Past Medical History:  Diagnosis Date  . Abdominal pain   . Allergy   . Asthma   . Irregular menstrual cycle   . Ovarian cyst     Past Surgical History:  Past Surgical History:  Procedure Laterality Date  . OVARIAN CYST REMOVAL      Family History:  Family History  Problem Relation Age of Onset  . GER disease Maternal Uncle     Social History:  Social History   Social History  . Marital status: Single    Spouse name: N/A  . Number of children: N/A  . Years of education: N/A   Occupational History  . Not on file.   Social History Main Topics  . Smoking status: Never Smoker  .  Smokeless tobacco: Never Used  . Alcohol use Yes  . Drug use: Yes    Types: Marijuana  . Sexual activity: Yes   Other Topics Concern  . Not on file   Social History Narrative   12th grade    Allergies:  Allergies  Allergen Reactions  . Penicillins     Medications: Prior to Admission medications   Medication Sig Start Date End Date Taking? Authorizing Provider  ferrous sulfate (EQL SLOW RELEASE IRON) 160 (50 Fe) MG TBCR SR tablet Take 1 tablet (160 mg total) by mouth daily. 09/19/16   Copland, Ilona Sorrel, PA-C  ibuprofen (ADVIL,MOTRIN) 600 MG tablet Take 1 tablet (600 mg total) by mouth every 6 (six) hours. 10/22/16   Vena Austria, MD  NIFEdipine (PROCARDIA-XL/ADALAT CC) 30 MG 24 hr tablet TAKE 1 TABLET BY MOUTH EVERY DAY 11/18/16   Farrel Conners, CNM  Prenatal Vit-Fe Fumarate-FA (PRENATAL MULTIVITAMIN) TABS tablet Take 1 tablet by mouth daily at 12 noon.    [provider]    Physical Exam Vitals:  Vitals:   12/02/16 1357  BP: (!) 96/56  Pulse: 97    General: NAD HEENT: normocephalic, anicteric Pulmonary: No increased work of breathing Abdomen: NABS, soft, non-tender, non-distended.  Umbilicus without lesions.  No hepatomegaly, splenomegaly or masses palpable. No evidence of hernia. Genitourinary:  External: Normal external female genitalia.  Normal urethral meatus, normal  Bartholin's and Skene's glands.    Vagina: Normal vaginal mucosa, no evidence of prolapse.  Cervix: Grossly normal in appearance, no bleeding  Uterus: Non-enlarged, mobile, normal contour.  No CMT  Adnexa: ovaries non-enlarged, no adnexal masses  Rectal: deferred Extremities: no edema, erythema, or tenderness Neurologic: Grossly intact Psychiatric: mood appropriate, affect full  Assessment: 21 y.o. G1P1001 presenting for 6 week postpartum visit  Plan: Problem List Items Addressed This Visit    None    Visit Diagnoses    6 weeks postpartum follow-up    -  Primary   Relevant  Orders   Pap IG w/ reflex to HPV when ASC-U   Cervical cancer screening       Relevant Orders   Pap IG w/ reflex to HPV when ASC-U       1) Contraception Education given regarding options for contraception, including oral contraceptives.  2)  Pap - ASCCP guidelines and rational discussed.  Patient opts for 3 year screening interval  3) Patient underwent screening for postpartum depression with no concerns noted.  4) GHTN - normotensive today discontinue nifedipine  5) Follow up 1 year for routine annual exam

## 2016-12-07 LAB — PAP IG W/ RFLX HPV ASCU: PAP Smear Comment: 0

## 2016-12-07 LAB — HPV DNA PROBE HIGH RISK, AMPLIFIED: HPV, high-risk: POSITIVE — AB

## 2016-12-09 ENCOUNTER — Telehealth: Payer: Self-pay

## 2016-12-09 NOTE — Telephone Encounter (Signed)
Pt calls triage line stating she wants to change her birthcontrol.  I reviewed her chart, she was seen last week 7/13 for her postpartum appointment with AMS. He put her on Lo Loestrin FE. She states she is having mood swings. I advised her the medication hasn't had a chance to get in her system good enough after only a week to cause that kind of reaction. Advised to give it a few more weeks and if still feels like it is the medication she could call back. I did inquire if she thought it could be pp depression and she stated no that AMS had talked to her about that and the symptoms to look for.   Message forward to AMS to review and advise if any other information needs to be relayed to patient. KJ CMA

## 2016-12-12 NOTE — Telephone Encounter (Signed)
Pt is returning call from Dr. Bonney AidStaebler please call pt back at # [phone]

## 2017-01-19 ENCOUNTER — Encounter: Payer: Self-pay | Admitting: Family Medicine

## 2017-01-19 ENCOUNTER — Ambulatory Visit (INDEPENDENT_AMBULATORY_CARE_PROVIDER_SITE_OTHER): Payer: Self-pay | Admitting: Family Medicine

## 2017-01-19 VITALS — BP 112/71 | HR 83 | Temp 98.9°F | Wt 127.5 lb

## 2017-01-19 DIAGNOSIS — H6981 Other specified disorders of Eustachian tube, right ear: Secondary | ICD-10-CM | POA: Diagnosis not present

## 2017-01-19 MED ORDER — PREDNISONE 50 MG PO TABS: 50.0000 mg | ORAL_TABLET | Freq: Every day | ORAL | 0 refills | Status: DC

## 2017-01-19 NOTE — Progress Notes (Signed)
BP 112/71 (BP Location: Left Arm, Patient Position: Sitting, Cuff Size: Normal)   Pulse 83   Temp 98.9 F (37.2 C)   Wt 127 lb 8 oz (57.8 kg)   SpO2 99%   BMI 23.32 kg/m    Subjective:    Patient ID: Sherry Nunez, female    DOB: 1995-11-11, 21 y.o.   MRN: [id number]  HPI: Sherry Nunez is a 21 y.o. female  Chief Complaint  Patient presents with  . Ear Pain    right   EAR PAIN Duration: Started this Morning Involved ear(s): right Severity:  moderate  Quality: lots of pressure Fever: no Otorrhea: no Upper respiratory infection symptoms: no Pruritus: no Hearing loss: no Water immersion no Using Q-tips: yes Recurrent otitis media: no Status: stable Treatments attempted: none  Relevant past medical, surgical, family and social history reviewed and updated as indicated. Interim medical history since our last visit reviewed. Allergies and medications reviewed and updated.  Review of Systems  Constitutional: Negative.   HENT: Positive for congestion, ear discharge, ear pain, postnasal drip and sore throat. Negative for dental problem, drooling, facial swelling, hearing loss, mouth sores, nosebleeds, rhinorrhea, sinus pain, sinus pressure, sneezing, tinnitus, trouble swallowing and voice change.   Respiratory: Negative.   Cardiovascular: Negative.   Psychiatric/Behavioral: Negative.     Per HPI unless specifically indicated above     Objective:    BP 112/71 (BP Location: Left Arm, Patient Position: Sitting, Cuff Size: Normal)   Pulse 83   Temp 98.9 F (37.2 C)   Wt 127 lb 8 oz (57.8 kg)   SpO2 99%   BMI 23.32 kg/m   Wt Readings from Last 3 Encounters:  01/19/17 127 lb 8 oz (57.8 kg)  12/02/16 161 lb (73 kg)  10/18/16 176 lb (79.8 kg)    Physical Exam  Constitutional: She is oriented to person, place, and time. She appears well-developed and well-nourished. No distress.  HENT:  Head: Normocephalic and atraumatic.  Right Ear: Hearing, tympanic membrane,  external ear and ear canal normal.  Left Ear: Hearing, tympanic membrane, external ear and ear canal normal.  Nose: Nose normal.  Mouth/Throat: Oropharynx is clear and moist. No oropharyngeal exudate.  Eyes: Pupils are equal, round, and reactive to light. Conjunctivae, EOM and lids are normal. Right eye exhibits no discharge. Left eye exhibits no discharge. No scleral icterus.  Neck: Normal range of motion. Neck supple. No JVD present. No tracheal deviation present. No thyromegaly present.  Cardiovascular: Normal rate, regular rhythm, normal heart sounds and intact distal pulses.  Exam reveals no gallop and no friction rub.   No murmur heard. Pulmonary/Chest: Effort normal and breath sounds normal. No stridor. No respiratory distress. She has no wheezes. She has no rales. She exhibits no tenderness.  Musculoskeletal: Normal range of motion.  Lymphadenopathy:    She has no cervical adenopathy.  Neurological: She is alert and oriented to person, place, and time.  Skin: Skin is warm, dry and intact. No rash noted. She is not diaphoretic. No erythema. No pallor.  Psychiatric: She has a normal mood and affect. Her speech is normal and behavior is normal. Judgment and thought content normal. Cognition and memory are normal.  Nursing note and vitals reviewed.   Results for orders placed or performed in visit on 12/02/16  HPV DNA Probe (High Risk)  Result Value Ref Range   HPV, high-risk Positive (A) Negative  Pap IG w/ reflex to HPV when ASC-U  Result Value  Ref Range   DIAGNOSIS: Comment (A)    Specimen adequacy: Comment    Clinician Provided ICD10 Comment    Performed by: Comment    Electronically signed by: Comment    PAP Smear Comment .    PATHOLOGIST PROVIDED ICD10: Comment    Note: Comment    Test Methodology Comment    PAP Reflex Comment       Assessment & Plan:   Problem List Items Addressed This Visit    None    Visit Diagnoses    ETD (Eustachian tube dysfunction), right   (Chronic)   -  Primary   Will treat with 4 days of prednisone. Call if not getting better or getting worse.        Follow up plan: Return if symptoms worsen or fail to improve, for Physical.

## 2017-06-02 ENCOUNTER — Encounter: Payer: Self-pay | Admitting: Obstetrics and Gynecology

## 2017-06-02 ENCOUNTER — Ambulatory Visit (INDEPENDENT_AMBULATORY_CARE_PROVIDER_SITE_OTHER): Payer: Medicaid Other | Admitting: Obstetrics and Gynecology

## 2017-06-02 VITALS — BP 106/60 | HR 96 | Ht 62.0 in | Wt 128.0 lb

## 2017-06-02 DIAGNOSIS — N939 Abnormal uterine and vaginal bleeding, unspecified: Secondary | ICD-10-CM

## 2017-06-02 DIAGNOSIS — Z113 Encounter for screening for infections with a predominantly sexual mode of transmission: Secondary | ICD-10-CM | POA: Diagnosis not present

## 2017-06-02 DIAGNOSIS — R102 Pelvic and perineal pain: Secondary | ICD-10-CM | POA: Diagnosis not present

## 2017-06-02 LAB — POCT URINALYSIS DIPSTICK
Bilirubin, UA: NEGATIVE
Blood, UA: NEGATIVE
Glucose, UA: NEGATIVE
KETONES UA: NEGATIVE
Leukocytes, UA: NEGATIVE
NITRITE UA: NEGATIVE
PH UA: 5.5 (ref 5.0–8.0)
PROTEIN UA: NEGATIVE
Spec Grav, UA: 1.02 (ref 1.010–1.025)
UROBILINOGEN UA: NEGATIVE U/dL — AB

## 2017-06-02 MED ORDER — NORGESTIMATE-ETH ESTRADIOL 0.25-35 MG-MCG PO TABS: 1.0000 | ORAL_TABLET | Freq: Every day | ORAL | 11 refills | Status: DC

## 2017-06-02 NOTE — Patient Instructions (Signed)
Pelvic Pain, Female Pelvic pain is pain in your lower abdomen, below your belly button and between your hips. The pain may start suddenly (acute), keep coming back (recurring), or last a long time (chronic). Pelvic pain that lasts longer than six months is considered chronic. Pelvic pain may affect your:  Reproductive organs.  Urinary system.  Digestive tract.  Musculoskeletal system.  There are many potential causes of pelvic pain. Sometimes, the pain can be a result of digestive or urinary conditions, strained muscles or ligaments, or even reproductive conditions. Sometimes the cause of pelvic pain is not known. Follow these instructions at home:  Take over-the-counter and prescription medicines only as told by your health care provider.  Rest as told by your health care provider.  Do not have sex it if hurts.  Keep a journal of your pelvic pain. Write down: ? When the pain started. ? Where the pain is located. ? What seems to make the pain better or worse, such as food or your menstrual cycle. ? Any symptoms you have along with the pain.  Keep all follow-up visits as told by your health care provider. This is important. Contact a health care provider if:  Medicine does not help your pain.  Your pain comes back.  You have new symptoms.  You have abnormal vaginal discharge or bleeding, including bleeding after menopause.  You have a fever or chills.  You are constipated.  You have blood in your urine or stool.  You have foul-smelling urine.  You feel weak or lightheaded. Get help right away if:  You have sudden severe pain.  Your pain gets steadily worse.  You have severe pain along with fever, nausea, vomiting, or excessive sweating.  You lose consciousness. This information is not intended to replace advice given to you by your health care provider. Make sure you discuss any questions you have with your health care provider. Document Released: 04/05/2004  Document Revised: 06/03/2015 Document Reviewed: 02/27/2015 Elsevier Interactive Patient Education  2018 Elsevier Inc.  

## 2017-06-02 NOTE — Progress Notes (Signed)
Gynecology Pelvic Pain Evaluation   Chief Complaint:  Chief Complaint  Patient presents with  . Pelvic Pain    Low pelvic pain x few months  . Contraception    Discuss other options/stopped pills    History of Present Illness:   Patient is a 22 y.o. G1P1001 who LMP was Patient's last menstrual period was 04/27/2017 (exact date)., presents today for a problem visit.  She complains of intermittent central pelvic pain.   Her pain is localized to the suprapubic area, described as intermittent, began shortly after her last delivery (C-section) and its severity is described as moderate. The pain radiates to the  back. She has these associated symptoms which include abdominal pain and dyspareunia. Patient has these modifiers which include lying down that make it better and unable to associate with any factor that make it worse.  Context includes: spontaneous and some exacerbation around time of menstruation and with intercourse.  Their symptoms are aggravated by menses and intercourse.  Previous evaluation: none. Prior Diagnosis: none. Previous Treatment: none.  Review of Systems: ROS  Past Medical History:  Past Medical History:  Diagnosis Date  . Abdominal pain   . Allergy   . Asthma   . Irregular menstrual cycle   . Ovarian cyst     Past Surgical History:  Past Surgical History:  Procedure Laterality Date  . OVARIAN CYST REMOVAL      Gynecologic History:  Patient's last menstrual period was 04/27/2017 (exact date). She is single partner, contraception - none.  Contraception: none. Hx of STDs: none. Menstrual Interval: 28  days Duration of flow: 5 days Heavy Menses: no Clots: no Intermenstrual Bleeding: no Postcoital Bleeding: no Dysmenorrhea: no  Obstetric History: G1P1001  Family History:  Family History  Problem Relation Age of Onset  . GER disease Maternal Uncle     Social History:  Social History   Socioeconomic History  . Marital status: Single   Spouse name: Not on file  . Number of children: Not on file  . Years of education: Not on file  . Highest education level: Not on file  Social Needs  . Financial resource strain: Not on file  . Food insecurity - worry: Not on file  . Food insecurity - inability: Not on file  . Transportation needs - medical: Not on file  . Transportation needs - non-medical: Not on file  Occupational History  . Not on file  Tobacco Use  . Smoking status: Former Smoker    Packs/day: 0.50  . Smokeless tobacco: Never Used  Substance and Sexual Activity  . Alcohol use: Yes    Comment: occ  . Drug use: No    Comment: Former/quit 09/2016  . Sexual activity: Not Currently    Birth control/protection: None  Other Topics Concern  . Not on file  Social History Narrative   12th grade    Allergies:  Allergies  Allergen Reactions  . Penicillins     Medications: Prior to Admission medications   Not on File    Physical Exam Vitals: Blood pressure 106/60, pulse 96, height 5\' 2"  (1.575 m), weight 128 lb (58.1 kg), last menstrual period 04/27/2017, not currently breastfeeding.  General: NAD HEENT: normocephalic, anicteric Pulmonary: No increased work of breathing Abdomen: NABS, soft, non-tender, non-distended.  Umbilicus without lesions.  No hepatomegaly, splenomegaly or masses palpable. No evidence of hernia , prior C-section scar well healed Genitourinary:  External: Normal external female genitalia.  Normal urethral meatus, normal  Bartholin's  and Skene's glands.    Vagina: Normal vaginal mucosa, no evidence of prolapse.    Cervix: Grossly normal in appearance, no bleeding  Uterus: Non-enlarged, mobile, normal contour.  No CMT  Adnexa: ovaries non-enlarged, no adnexal masses  Rectal: deferred  Lymphatic: no evidence of inguinal lymphadenopathy Extremities: no edema, erythema, or tenderness Neurologic: Grossly intact Psychiatric: mood appropriate, affect full  Female chaperone present for  pelvic portion of the physical exam  Assessment: 22 y.o. G1P1001 with chronic pelvic pain  Problem List Items Addressed This Visit    None    Visit Diagnoses    Screen for STD (sexually transmitted disease)    -  Primary   Relevant Orders   GC/Chlamydia Probe Amp   Pelvic pain in female       Relevant Orders   GC/Chlamydia Probe Amp   POCT urinalysis dipstick (Completed)   Abnormal uterine bleeding       Relevant Orders   TSH (Completed)   Prolactin (Completed)   POCT urinalysis dipstick (Completed)       We discussed the possible etiologies for pelvic pain in women.  Gynecologic causes may include endometriosis, adenomyosis, pelvic inflammatory disease (PID), ovarian cysts, ovarian or tubal torsion, and in rare case gynecologic malignancy such as cervical, uterine, or ovarian cancer.  In addition thee possibility of non-gynecologic etiologies such as urinary or GI tract pathology or disordered, as well as musculoskeletal problems.  The goal is to complete a basic work up in hopes of identifying the underlying cause which in turn will dictate treatment.  In the meantime supportive measures such as localized heat, and NSAIDs are reasonable first steps.     - Prescription drug database was not reviewed, UDS was not ordered - Transvaginal ultrasound not ordered (no structural lesions noted on prior ultrasound no evidence of endometriosis at time of C-section) - Blood work obtained today Yes  - Cervical cultures Yes - UA reviewed - restart sprintec OCP  A total of 15 minutes were spent in face-to-face contact with the patient during this encounter with over half of that time devoted to counseling and coordination of care.  Return in about 7 months (around 12/31/2017) for annual exam repeat pap.

## 2017-06-03 LAB — TSH: TSH: 0.783 u[IU]/mL (ref 0.450–4.500)

## 2017-06-03 LAB — PROLACTIN: PROLACTIN: 2.9 ng/mL — AB (ref 4.8–23.3)

## 2017-06-06 LAB — GC/CHLAMYDIA PROBE AMP
CHLAMYDIA, DNA PROBE: NEGATIVE
NEISSERIA GONORRHOEAE BY PCR: NEGATIVE

## 2017-08-03 ENCOUNTER — Encounter: Payer: Self-pay | Admitting: Obstetrics and Gynecology

## 2017-08-03 ENCOUNTER — Ambulatory Visit (INDEPENDENT_AMBULATORY_CARE_PROVIDER_SITE_OTHER): Payer: Medicaid Other | Admitting: Obstetrics and Gynecology

## 2017-08-03 VITALS — BP 104/52 | HR 92 | Ht 62.0 in | Wt 128.0 lb

## 2017-08-03 DIAGNOSIS — F419 Anxiety disorder, unspecified: Secondary | ICD-10-CM | POA: Diagnosis not present

## 2017-08-03 DIAGNOSIS — N939 Abnormal uterine and vaginal bleeding, unspecified: Secondary | ICD-10-CM | POA: Diagnosis not present

## 2017-08-03 MED ORDER — HYDROXYZINE HCL 25 MG PO TABS: 25.0000 mg | ORAL_TABLET | Freq: Four times a day (QID) | ORAL | 2 refills | Status: DC | PRN

## 2017-08-03 NOTE — Patient Instructions (Signed)
[facility]  Address: [address], [place], [place] [address]  Hours:  Closes soon: 5PM ? Derl BarrowOpens 8AM Fri  Phone: [phone]  [facility]  Address: [address], [place], [place] [address]  Hours:  Open ? Closes 6PM  Phone: [phone]
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
## 2018-01-01 ENCOUNTER — Telehealth: Payer: Self-pay | Admitting: Family Medicine

## 2018-01-01 ENCOUNTER — Encounter: Payer: Self-pay | Admitting: Emergency Medicine

## 2018-01-01 ENCOUNTER — Ambulatory Visit: Payer: Medicaid Other | Admitting: Obstetrics and Gynecology

## 2018-01-01 ENCOUNTER — Emergency Department
Admission: EM | Admit: 2018-01-01 | Discharge: 2018-01-01 | Disposition: A | Discharge status: Home / Self Care | Payer: Medicaid Other | Attending: Emergency Medicine | Admitting: Emergency Medicine

## 2018-01-01 ENCOUNTER — Emergency Department: Payer: Medicaid Other

## 2018-01-01 ENCOUNTER — Other Ambulatory Visit: Payer: Self-pay

## 2018-01-01 ENCOUNTER — Ambulatory Visit (INDEPENDENT_AMBULATORY_CARE_PROVIDER_SITE_OTHER): Payer: Medicaid Other | Admitting: Physician Assistant

## 2018-01-01 ENCOUNTER — Encounter: Payer: Self-pay | Admitting: Physician Assistant

## 2018-01-01 VITALS — BP 112/74 | HR 71 | Temp 98.6°F | Ht 62.0 in | Wt 127.2 lb

## 2018-01-01 DIAGNOSIS — R51 Headache: Secondary | ICD-10-CM | POA: Diagnosis present

## 2018-01-01 DIAGNOSIS — F17210 Nicotine dependence, cigarettes, uncomplicated: Secondary | ICD-10-CM | POA: Diagnosis not present

## 2018-01-01 DIAGNOSIS — E039 Hypothyroidism, unspecified: Secondary | ICD-10-CM | POA: Diagnosis not present

## 2018-01-01 DIAGNOSIS — J45909 Unspecified asthma, uncomplicated: Secondary | ICD-10-CM | POA: Diagnosis not present

## 2018-01-01 DIAGNOSIS — G44209 Tension-type headache, unspecified, not intractable: Secondary | ICD-10-CM

## 2018-01-01 DIAGNOSIS — J0191 Acute recurrent sinusitis, unspecified: Secondary | ICD-10-CM | POA: Insufficient documentation

## 2018-01-01 LAB — POCT PREGNANCY, URINE: PREG TEST UR: NEGATIVE

## 2018-01-01 MED ORDER — KETOROLAC TROMETHAMINE 60 MG/2ML IM SOLN
60.0000 mg | Freq: Once | INTRAMUSCULAR | Status: AC
  Administered 2018-01-01: 60 mg via INTRAMUSCULAR
  Filled 2018-01-01: qty 2

## 2018-01-01 MED ORDER — AZITHROMYCIN 250 MG PO TABS: 250.0000 mg | ORAL_TABLET | Freq: Every day | ORAL | 0 refills | Status: DC

## 2018-01-01 MED ORDER — FLUTICASONE PROPIONATE 50 MCG/ACT NA SUSP: 2.0000 | Freq: Every day | NASAL | 0 refills | Status: DC

## 2018-01-01 MED ORDER — AZITHROMYCIN 500 MG PO TABS
500.0000 mg | ORAL_TABLET | Freq: Once | ORAL | Status: AC
  Administered 2018-01-01: 500 mg via ORAL
  Filled 2018-01-01: qty 1

## 2018-01-01 MED ORDER — OXYMETAZOLINE HCL 0.05 % NA SOLN
2.0000 | Freq: Once | NASAL | Status: AC
  Administered 2018-01-01: 2 via NASAL
  Filled 2018-01-01 (×2): qty 15

## 2018-01-01 NOTE — Telephone Encounter (Signed)
Copied from CRM [id number]. Topic: Quick Communication - See Telephone Encounter >> Jan 01, 2018 10:45 AM Luanna Coleawoud, Jessica L wrote: CRM for notification. See Telephone encounter for: 01/01/18. Pt called and stated that she has been having bad headaches and would like to be seen as soon as possible. Pt would like to be work in. Please advise
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
## 2018-01-01 NOTE — Patient Instructions (Signed)
Headache and Arthritis If you have arthritis and headaches, it is possible the two problems are related. Some headaches can be caused by arthritis in your neck (cervicogenic headaches). Pain medicine is another possible link between arthritis and headache. If you take a lot of over-the-counter medicines for arthritis pain, you may develop the type of headache that can happen when you stop taking your over-the-counter pain reliever or lower your dose too quickly (rebound headache). What types of arthritis can cause a headache? There are two types of arthritis, rheumatoid arthritis and osteoarthritis. Both types of arthritis can cause headaches.  Rheumatoid arthritis (RA) is an autoimmune disease that causes inflammation of your joints. When you have RA, your body's defense system (immune system) attacks the joints of your body and causes inflammation. This can lead to deformity over time.  Osteoarthritis (OA) is wear and tear caused by joint use over time. Osteoarthritis is not an inflammatory disease.  Both OA and RA can cause neck pain that is felt in the head. When the pain is felt in a different location than it originates, it is called radiating or referred pain. This pain is usually felt in the back of the head. How are headaches and arthritis related? RA can affect any joint in the body, including the joints between the bones of the neck (cervical vertebrae). The neck joints most commonly affected by RA are the top two joints, between the first and second cervical vertebra. Inflammation in these joints may be felt as neck pain and head pain. OA of the neck may be caused by gradual wear and tear or by a neck injury. Neck vertebrae may develop calcium deposits in the areas where muscle attach. Wear and tear of the vertebra may cause pressure on the nerves that leave the spinal cord. These changes can cause referred pain that may be felt as a headache. How are headaches associated with arthritis  diagnosed?  Your health care provider may diagnose headache caused by RA if you have inflammation of vertebrae in your neck. You may have: ? Blood tests to measure how much inflammation you have. ? Imaging studies of your neck (MRI) to check for inflammation of cervical vertebrae.  Your health care provider may diagnose headache caused by OA if an X-ray shows: ? Calcium deposits. ? Bone spurs. ? Narrowing of the space between neck vertebrae.  Your health care provider may diagnose rebound headache if you have a history of using over-the-counter pain relievers frequently. When should I seek care for my headaches? Call your health care provider if:  You have more than three headaches per week.  You take an over-the-counter pain reliever almost every day.  Your headaches are getting worse and happening more often.  You have headache with fever.  You have headache with numbness, weakness, or dizziness.  You have headache with nausea or vomiting.  What are my treatment options?  If you have headache caused by RA, treatment may include: ? Over-the-counter or prescription-strength anti-inflammatory medicines. ? Disease-modifying antirheumatic drugs (DMARDs). These medicines slow or stop the progression of RA.  If you have headache caused by OA, treatment may include: ? Over-the-counter pain medicines. ? Heat or massage. ? Physical therapy.  If you have rebound headaches: ? They will usually go away within several days of stopping the medicine that caused them. ? You may be able to gradually reduce the amount of medicines you take to prevent headache. ? Ask your health care provider if you can take   another type of medicine instead. This information is not intended to replace advice given to you by your health care provider. Make sure you discuss any questions you have with your health care provider. Document Released: 07/30/2003 Document Revised: 10/15/2015 Document Reviewed:  08/12/2013 Elsevier Interactive Patient Education  2018 [employer]vier Inc.  

## 2018-01-01 NOTE — ED Provider Notes (Signed)
[facility] Emergency Department Provider Note  Time seen: 6:49 PM  I have reviewed the triage vital signs and the nursing notes.   HISTORY  Chief Complaint Headache and Numbness    HPI Sherry Nunez is a 22 y.o. female with a past medical history of asthma, presents to the emergency department for headache.  According to the patient for the past 5 days she has had a fairly significant headache with nasal congestion.  She states earlier today she had tingling in her left arm.  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  She went to her doctor today for the headache, they referred her to the ER since she had tingling in her left arm.  Patient denies any symptoms at this time besides a moderate headache.  Patient has moderate rhinorrhea evident during our evaluation.  Denies any fever.  States she did have a sinus headache and sinus infection proximal me 6 weeks ago but never took the antibiotics.   Past Medical History:  Diagnosis Date  . Abdominal pain   . Allergy   . Asthma   . Irregular menstrual cycle   . Ovarian cyst     Patient Active Problem List   Diagnosis Date Noted  . 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 Lumbago 10/14/2016  . Hypothyroid 08/25/2016  . History of trichomoniasis 08/25/2016  . BMI 34.0-34.9,adult 08/25/2016  . Asthma 07/08/2015  . Allergic rhinitis 07/08/2015  . Simple constipation 02/13/2012  . Generalized abdominal pain   . Right ovarian cyst     Past Surgical History:  Procedure Laterality Date  . OVARIAN CYST REMOVAL      Prior to Admission medications   Not on File    Allergies  Allergen Reactions  . Penicillins     Family History  Problem Relation Age of Onset  . GER disease Maternal Uncle     Social History Social History   Tobacco Use  . Smoking status: Current Every Day Smoker    Packs/day: 0.30    Types: Cigarettes   . Smokeless tobacco: Never Used  Substance Use Topics  . Alcohol use: Yes    Comment: occ  . Drug use: No    Comment: Former/quit 09/2016    Review of Systems Constitutional: Negative for fever. Eyes: Negative for visual complaints ENT: Positive for nasal discharge and sinus pressure Cardiovascular: Negative for chest pain. Respiratory: Negative for shortness of breath. Gastrointestinal: Negative for abdominal pain Genitourinary: Negative for urinary compaints Musculoskeletal: Negative for musculoskeletal complaints Skin: Negative for skin complaints  Neurological: Moderate to significant headache All other ROS negative  ____________________________________________   PHYSICAL EXAM:  VITAL SIGNS: ED Triage Vitals  Enc Vitals Group     BP 01/01/18 1700 123/81     Pulse Rate 01/01/18 1700 76     Resp 01/01/18 1700 18     Temp 01/01/18 1700 98.1 F (36.7 C)     Temp Source 01/01/18 1700 Oral     SpO2 01/01/18 1700 98 %     Weight 01/01/18 1702 127 lb (57.6 kg)     Height 01/01/18 1702 5\' 1"  (1.549 m)     Head Circumference --      Peak Flow --      Pain Score 01/01/18 1702 8     Pain Loc --      Pain Edu? --  Excl. in GC? --    Constitutional: Alert and oriented. Well appearing and in no distress. Eyes: Normal exam ENT   Head: Normocephalic.  Moderate maxillary frontal and ethmoid sinus tenderness to percussion especially in the left side.  Moderate rhinorrhea.   Mouth/Throat: Mucous membranes are moist. Cardiovascular: Normal rate, regular rhythm. No murmur Respiratory: Normal respiratory effort without tachypnea nor retractions. Breath sounds are clear  Gastrointestinal: Soft and nontender. No distention.  Musculoskeletal: Nontender with normal range of motion in all extremities.  Neurologic:  Normal speech and language. No gross focal neurologic deficits Skin:  Skin is warm, dry and intact.  Psychiatric: Mood and affect are normal. Speech and behavior  are normal.   ____________________________________________   RADIOLOGY  CT is negative for intracranial abnormality moderate to severe sinusitis bilaterally. ____________________________________________   INITIAL IMPRESSION / ASSESSMENT AND PLAN / ED COURSE  Pertinent labs & imaging results that were available during my care of the patient were reviewed by me and considered in my medical decision making (see chart for details).  Patient presents emergency department for headache for the past 5 days, rhinorrhea, and tingling in her left arm which has since resolved.  Patient states tingling in the arm is fairly chronic for her.  States she rarely ever gets headaches making this fairly abnormal for her.  On examination patient has moderate sinus tenderness to percussion bilaterally left greater than right.  Has rhinorrhea/congestion on exam.  CT scan shows significant sinusitis otherwise negative.  Highly suspect sinus infection leading to sinus headache.  We will dose Toradol, Afrin and treat with Zithromax as the patient has a penicillin allergy.  I discussed with the patient ENT follow-up we will also place the patient on Flonase for possible chronic sinusitis.  ____________________________________________   FINAL CLINICAL IMPRESSION(S) / ED DIAGNOSES  Sinus headache Sinusitis    Minna AntisPaduchowski, Marvelous Bouwens, MD 01/01/18 1940
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
## 2018-01-01 NOTE — Progress Notes (Signed)
Subjective:    Patient ID: Sherry Nunez, female    DOB: 08-15-1995, 22 y.o.   MRN: [id number]  Sherry Nunez is a 22 y.o. female presenting on 01/01/2018 for Headache (pt states she has been having headaches since Wednesday) and URI (pt states she thinks she may have a sinus infection, states if she does, she would like a liquid medication as she does not take pills well)   HPI   No contributory PMH. Reports headache since Wednesday which she reports has been worsening. She says she worked a double shift and also is getting less sleep due to newborn. She Denies any visual change. Denies any weakness. Denies any word finding difficulty. She said the other day she leaned on her elbow and it trembled. She wants to know if this is a sinus infection. When asked if this is the worst headache of her life, she says yes it is.      Social History   Tobacco Use  . Smoking status: Current Every Day Smoker    Packs/day: 0.30    Types: Cigarettes  . Smokeless tobacco: Never Used  Substance Use Topics  . Alcohol use: Yes    Comment: occ  . Drug use: No    Comment: Former/quit 09/2016    Review of Systems Per HPI unless specifically indicated above     Objective:    BP 112/74   Pulse 71   Temp 98.6 F (37 C) (Oral)   Ht 5\' 2"  (1.575 m)   Wt 127 lb 3.2 oz (57.7 kg)   LMP 12/25/2017   SpO2 98%   BMI 23.27 kg/m   Wt Readings from Last 3 Encounters:  01/01/18 127 lb (57.6 kg)  01/01/18 127 lb 3.2 oz (57.7 kg)  08/03/17 128 lb (58.1 kg)    Physical Exam  Constitutional: She is oriented to person, place, and time. She appears well-developed and well-nourished.  HENT:  Head: Normocephalic and atraumatic.  Eyes: Pupils are equal, round, and reactive to light. EOM are normal. Right eye exhibits normal extraocular motion. Left eye exhibits normal extraocular motion.  Neck: Neck supple.  Cardiovascular: Normal rate and normal heart sounds.  Pulmonary/Chest: Effort normal and breath  sounds normal.  Neurological: She is alert and oriented to person, place, and time. She displays normal reflexes. GCS eye subscore is 4. GCS verbal subscore is 5. GCS motor subscore is 6.  Skin: Skin is warm and dry.  Psychiatric: She has a normal mood and affect. Her behavior is normal.   Results for orders placed or performed in visit on 06/02/17  GC/Chlamydia Probe Amp  Result Value Ref Range   Chlamydia trachomatis, NAA Negative Negative   Neisseria gonorrhoeae by PCR Negative Negative  TSH  Result Value Ref Range   TSH 0.783 0.450 - 4.500 uIU/mL  Prolactin  Result Value Ref Range   Prolactin 2.9 (L) 4.8 - 23.3 ng/mL  POCT urinalysis dipstick  Result Value Ref Range   Color, UA yellow    Clarity, UA Clear    Glucose, UA neg    Bilirubin, UA neg    Ketones, UA neg    Spec Grav, UA 1.020 1.010 - 1.025   Blood, UA Neg    pH, UA 5.5 5.0 - 8.0   Protein, UA Neg    Urobilinogen, UA negative (A) 0.2 or 1.0 E.U./dL   Nitrite, UA Neg    Leukocytes, UA Negative Negative   Appearance     Odor  Assessment & Plan:  1. Tension headache  Patient says this is ABSOLUTELY the worst headache of her life. I counseled her that when she says this, I have to treat it like a stroke and send her to the ER for a scan. Her neurological exam is normal. She has no focal deficits. I have low suspicion of stroke but she continues to report this is the worst headache of her life and as such have directed her to the ER.     Follow up plan: Return if symptoms worsen or fail to improve.  Osvaldo AngstAdriana Jhon Mallozzi, pA-C [facility]  [facility] 01/01/2018, 5:08 PM

## 2018-01-02 ENCOUNTER — Ambulatory Visit: Payer: Self-pay | Admitting: Family Medicine

## 2018-01-10 ENCOUNTER — Encounter: Payer: Self-pay | Admitting: Obstetrics and Gynecology

## 2018-01-10 ENCOUNTER — Other Ambulatory Visit (HOSPITAL_COMMUNITY)
Admission: RE | Admit: 2018-01-10 | Discharge: 2018-01-10 | Disposition: A | Discharge status: Home / Self Care | Payer: Medicaid Other | Source: Ambulatory Visit | Attending: Obstetrics and Gynecology | Admitting: Obstetrics and Gynecology

## 2018-01-10 ENCOUNTER — Ambulatory Visit (INDEPENDENT_AMBULATORY_CARE_PROVIDER_SITE_OTHER): Payer: Medicaid Other | Admitting: Obstetrics and Gynecology

## 2018-01-10 VITALS — BP 126/68 | HR 91 | Ht 62.0 in | Wt 127.0 lb

## 2018-01-10 DIAGNOSIS — Z01419 Encounter for gynecological examination (general) (routine) without abnormal findings: Secondary | ICD-10-CM

## 2018-01-10 DIAGNOSIS — Z113 Encounter for screening for infections with a predominantly sexual mode of transmission: Secondary | ICD-10-CM | POA: Insufficient documentation

## 2018-01-10 DIAGNOSIS — R87810 Cervical high risk human papillomavirus (HPV) DNA test positive: Secondary | ICD-10-CM | POA: Diagnosis present

## 2018-01-10 DIAGNOSIS — Z1231 Encounter for screening mammogram for malignant neoplasm of breast: Secondary | ICD-10-CM

## 2018-01-10 DIAGNOSIS — R87610 Atypical squamous cells of undetermined significance on cytologic smear of cervix (ASC-US): Secondary | ICD-10-CM

## 2018-01-10 DIAGNOSIS — Z124 Encounter for screening for malignant neoplasm of cervix: Secondary | ICD-10-CM | POA: Diagnosis not present

## 2018-01-10 DIAGNOSIS — Z1239 Encounter for other screening for malignant neoplasm of breast: Secondary | ICD-10-CM

## 2018-01-10 DIAGNOSIS — G8929 Other chronic pain: Secondary | ICD-10-CM

## 2018-01-10 DIAGNOSIS — R102 Pelvic and perineal pain: Secondary | ICD-10-CM

## 2018-01-10 DIAGNOSIS — Z0000 Encounter for general adult medical examination without abnormal findings: Secondary | ICD-10-CM | POA: Diagnosis not present

## 2018-01-10 DIAGNOSIS — N809 Endometriosis, unspecified: Secondary | ICD-10-CM

## 2018-01-10 MED ORDER — ELAGOLIX SODIUM 150 MG PO TABS: 1.0000 | ORAL_TABLET | Freq: Every day | ORAL | 5 refills | Status: DC

## 2018-01-10 NOTE — Progress Notes (Signed)
Gynecology Annual Exam  PCP: Dorcas CarrowJohnson, Megan P, DO  Chief Complaint:  Chief Complaint  Patient presents with  . Gynecologic Exam    repeat pap smear  . Pelvic Pain    History of Present Illness: Patient is a 22 y.o. G1P1001 presents for annual exam. The patient has no complaints today.   LMP: Patient's last menstrual period was 12/25/2017. Average Interval: regular, 28 days Duration of flow: 5 days Heavy Menses: no Clots: no Intermenstrual Bleeding: no Postcoital Bleeding: no Dysmenorrhea: yes  The patient is sexually active. She currently uses condoms for contraception. She has dyspareunia.  The patient does perform self breast exams.  There is no notable family history of breast or ovarian cancer in her family.  The patient wears seatbelts: yes.  The patient has regular exercise: not asked.    The patient denies current symptoms of depression.    Her pelvic abdominal pain reportedly got worse around the time of menses on sprintec which is why she discontinued.  Menses have stayed regular.  She reports pain most aggravated during intercourse.  She has also states that it is generally confined the left lower quadrant.    Review of Systems: Review of Systems  Constitutional: Negative for chills and fever.  HENT: Negative for congestion.   Respiratory: Negative for cough and shortness of breath.   Cardiovascular: Negative for chest pain and palpitations.  Gastrointestinal: Positive for abdominal pain. Negative for constipation, diarrhea, heartburn, nausea and vomiting.  Genitourinary: Negative for dysuria, frequency and urgency.  Skin: Negative for itching and rash.  Neurological: Negative for dizziness and headaches.  Endo/Heme/Allergies: Negative for polydipsia.  Psychiatric/Behavioral: Negative for depression.    Past Medical History:  Past Medical History:  Diagnosis Date  . Abdominal pain   . Allergy   . Asthma   . Irregular menstrual cycle   . Ovarian cyst      Past Surgical History:  Past Surgical History:  Procedure Laterality Date  . OVARIAN CYST REMOVAL      Gynecologic History:  Patient's last menstrual period was 12/25/2017. Contraception: condoms Last Pap: Results were: ASCUS HPV positive 2018  Obstetric History: G1P1001  Family History:  Family History  Problem Relation Age of Onset  . GER disease Maternal Uncle     Social History:  Social History   Socioeconomic History  . Marital status: Single    Spouse name: Not on file  . Number of children: Not on file  . Years of education: Not on file  . Highest education level: Not on file  Occupational History  . Not on file  Social Needs  . Financial resource strain: Not on file  . Food insecurity:    Worry: Not on file    Inability: Not on file  . Transportation needs:    Medical: Not on file    Non-medical: Not on file  Tobacco Use  . Smoking status: Current Every Day Smoker    Packs/day: 0.30    Types: Cigarettes  . Smokeless tobacco: Never Used  Substance and Sexual Activity  . Alcohol use: Yes    Comment: occ  . Drug use: No    Comment: Former/quit 09/2016  . Sexual activity: Yes    Birth control/protection: None  Lifestyle  . Physical activity:    Days per week: Not on file    Minutes per session: Not on file  . Stress: Not on file  Relationships  . Social connections:    Talks  on phone: Not on file    Gets together: Not on file    Attends religious service: Not on file    Active member of club or organization: Not on file    Attends meetings of clubs or organizations: Not on file    Relationship status: Not on file  . Intimate partner violence:    Fear of current or ex partner: Not on file    Emotionally abused: Not on file    Physically abused: Not on file    Forced sexual activity: Not on file  Other Topics Concern  . Not on file  Social History Narrative   12th grade    Allergies:  Allergies  Allergen Reactions  . Penicillins      Medications: Prior to Admission medications   Medication Sig Start Date End Date Taking? Authorizing Provider  fluticasone (FLONASE) 50 MCG/ACT nasal spray Place 2 sprays into both nostrils daily. 01/01/18 01/31/18 Yes Minna Antis, MD  azithromycin (ZITHROMAX) 250 MG tablet Take 1 tablet (250 mg total) by mouth daily. Patient not taking: Reported on 01/10/2018 01/01/18   Minna Antis, MD  Elagolix Sodium (ORILISSA) 150 MG TABS Take 1 tablet by mouth daily. 01/10/18   Vena Austria, MD    Physical Exam Vitals: Blood pressure 126/68, pulse 91, height 5\' 2"  (1.575 m), weight 127 lb (57.6 kg), last menstrual period 12/25/2017, not currently breastfeeding.  General: NAD HEENT: normocephalic, anicteric Thyroid: no enlargement, no palpable nodules Pulmonary: No increased work of breathing, CTAB Cardiovascular: RRR, distal pulses 2+ Breast: Breast symmetrical, no tenderness, no palpable nodules or masses, no skin or nipple retraction present, no nipple discharge.  No axillary or supraclavicular lymphadenopathy. Abdomen: NABS, soft, non-tender, non-distended.  Umbilicus without lesions.  No hepatomegaly, splenomegaly or masses palpable. No evidence of hernia  Genitourinary:  External: Normal external female genitalia.  Normal urethral meatus, normal Bartholin's and Skene's glands.    Vagina: Normal vaginal mucosa, no evidence of prolapse.    Cervix: Grossly normal in appearance, no bleeding  Uterus: Non-enlarged, mobile, normal contour.  No CMT  Adnexa: ovaries non-enlarged, no adnexal masses  Rectal: deferred  Lymphatic: no evidence of inguinal lymphadenopathy Extremities: no edema, erythema, or tenderness Neurologic: Grossly intact Psychiatric: mood appropriate, affect full  Female chaperone present for pelvic and breast  portions of the physical exam    Assessment: 22 y.o. G1P1001 routine annual exam  Plan: Problem List Items Addressed This Visit    None    Visit  Diagnoses    ASCUS with positive high risk HPV cervical    -  Primary   Relevant Orders   Cytology - PAP   Encounter for gynecological examination without abnormal finding       Screening for malignant neoplasm of cervix       Relevant Orders   Cytology - PAP   Breast screening       Routine screening for STI (sexually transmitted infection)       Relevant Orders   Cytology - PAP   Chronic pelvic pain in female       Endometriosis          1) Chronic pelvic pain - trial of orilissa to cover for presumptive endometriosis  2) STI screening  was notoffered and therefore not obtained  3)  ASCCP guidelines and rational discussed.  Patient opts for every 3 years screening interval - follow up ASCUS HPV positive pap from 2018 today  4) Contraception - the patient is currently  using  condoms.  She is happy with her current form of contraception and plans to continue We discussed safe sex practices to reduce her furture risk of STI's.    5) Return in about 6 weeks (around 02/21/2018) for medication follow up.   Vena AustriaAndreas Omer Monter, MD, Evern CoreFACOG [facility], [facility] 01/10/2018, 2:04 PM

## 2018-01-11 LAB — CYTOLOGY - PAP
Adequacy: ABSENT
CHLAMYDIA, DNA PROBE: NEGATIVE
DIAGNOSIS: NEGATIVE
Neisseria Gonorrhea: NEGATIVE

## 2018-07-13 ENCOUNTER — Emergency Department
Admission: EM | Admit: 2018-07-13 | Discharge: 2018-07-13 | Discharge status: Against Medical Advice | Payer: Medicaid Other | Attending: Emergency Medicine | Admitting: Emergency Medicine

## 2018-07-13 ENCOUNTER — Encounter: Payer: Self-pay | Admitting: Emergency Medicine

## 2018-07-13 ENCOUNTER — Other Ambulatory Visit: Payer: Self-pay

## 2018-07-13 DIAGNOSIS — M545 Low back pain: Secondary | ICD-10-CM | POA: Diagnosis present

## 2018-07-13 DIAGNOSIS — Z5321 Procedure and treatment not carried out due to patient leaving prior to being seen by health care provider: Secondary | ICD-10-CM | POA: Diagnosis not present

## 2018-07-13 LAB — BASIC METABOLIC PANEL
Anion gap: 8 (ref 5–15)
BUN: 10 mg/dL (ref 6–20)
CO2: 27 mmol/L (ref 22–32)
Calcium: 9.3 mg/dL (ref 8.9–10.3)
Chloride: 104 mmol/L (ref 98–111)
Creatinine, Ser: 0.87 mg/dL (ref 0.44–1.00)
GFR calc Af Amer: >60 mL/min (ref >60)
GLUCOSE: 87 mg/dL (ref 70–99)
Potassium: 3.8 mmol/L (ref 3.5–5.1)
Sodium: 139 mmol/L (ref 135–145)

## 2018-07-13 LAB — PREGNANCY, URINE: Preg Test, Ur: NEGATIVE

## 2018-07-13 LAB — URINALYSIS, COMPLETE (UACMP) WITH MICROSCOPIC
BILIRUBIN URINE: NEGATIVE
Bacteria, UA: NONE SEEN
Glucose, UA: NEGATIVE mg/dL
Hgb urine dipstick: NEGATIVE
Ketones, ur: NEGATIVE mg/dL
Leukocytes,Ua: NEGATIVE
Nitrite: NEGATIVE
Protein, ur: 30 mg/dL — AB
Specific Gravity, Urine: 1.023 (ref 1.005–1.030)
pH: 7 (ref 5.0–8.0)

## 2018-07-13 LAB — CBC
HCT: 44 % (ref 36.0–46.0)
Hemoglobin: 14.6 g/dL (ref 12.0–15.0)
MCH: 30 pg (ref 26.0–34.0)
MCHC: 33.2 g/dL (ref 30.0–36.0)
MCV: 90.5 fL (ref 80.0–100.0)
Platelets: 342 10*3/uL (ref 150–400)
RBC: 4.86 MIL/uL (ref 3.87–5.11)
RDW: 13.6 % (ref 11.5–15.5)
WBC: 8 10*3/uL (ref 4.0–10.5)
nRBC: 0 % (ref 0.0–0.2)

## 2018-07-13 LAB — POCT PREGNANCY, URINE: Preg Test, Ur: NEGATIVE

## 2018-07-13 NOTE — ED Triage Notes (Addendum)
C/O intermittent pain to left lower back.  States pain started 2 weeks ago.  Initially pain was a pressure feeling. Also c/o occasional leg tingling and feeling that leg is asleep, accompany symptoms.  States urine is darker than normal.  States pain initially started in 2018 after birth of daughter -- pain was near epidural site.

## 2018-07-16 ENCOUNTER — Telehealth: Payer: Self-pay | Admitting: Emergency Medicine

## 2018-07-16 NOTE — Telephone Encounter (Signed)
Called patient due to lwot to inquire about condition and follow up plans. Left message.   

## 2018-07-16 NOTE — Telephone Encounter (Signed)
Patient called me back.  Says she has appt next week.  I told her to inform her doctor that lab results were in epic and that she may return any time.

## 2018-10-13 ENCOUNTER — Other Ambulatory Visit: Payer: Self-pay

## 2018-10-13 ENCOUNTER — Emergency Department: Payer: Medicaid Other

## 2018-10-13 ENCOUNTER — Emergency Department
Admission: EM | Admit: 2018-10-13 | Discharge: 2018-10-13 | Disposition: A | Discharge status: Home / Self Care | Payer: Medicaid Other | Attending: Emergency Medicine | Admitting: Emergency Medicine

## 2018-10-13 DIAGNOSIS — M79641 Pain in right hand: Secondary | ICD-10-CM | POA: Insufficient documentation

## 2018-10-13 DIAGNOSIS — Z5321 Procedure and treatment not carried out due to patient leaving prior to being seen by health care provider: Secondary | ICD-10-CM | POA: Diagnosis not present

## 2018-10-13 HISTORY — DX: Coagulation defect, unspecified: D68.9
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
## 2018-10-13 NOTE — ED Notes (Signed)
Pt seen walking out of ED.

## 2018-10-14 ENCOUNTER — Emergency Department
Admission: EM | Admit: 2018-10-14 | Discharge: 2018-10-14 | Disposition: A | Discharge status: Home / Self Care | Payer: Medicaid Other | Attending: Emergency Medicine | Admitting: Emergency Medicine

## 2018-10-14 ENCOUNTER — Emergency Department: Payer: Medicaid Other

## 2018-10-14 DIAGNOSIS — M79641 Pain in right hand: Secondary | ICD-10-CM | POA: Insufficient documentation

## 2018-10-14 DIAGNOSIS — Z5321 Procedure and treatment not carried out due to patient leaving prior to being seen by health care provider: Secondary | ICD-10-CM | POA: Diagnosis not present
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
## 2018-10-14 NOTE — ED Notes (Signed)
Unable to locate patient to give an update. 

## 2018-10-14 NOTE — ED Notes (Signed)
Unable to locate patient to give update. 

## 2018-10-17 ENCOUNTER — Telehealth: Payer: Self-pay | Admitting: Emergency Medicine
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
## 2018-10-22 ENCOUNTER — Ambulatory Visit (INDEPENDENT_AMBULATORY_CARE_PROVIDER_SITE_OTHER): Payer: Medicaid Other | Admitting: Obstetrics and Gynecology

## 2018-10-22 ENCOUNTER — Other Ambulatory Visit: Payer: Self-pay

## 2018-10-22 ENCOUNTER — Other Ambulatory Visit (HOSPITAL_COMMUNITY)
Admission: RE | Admit: 2018-10-22 | Discharge: 2018-10-22 | Disposition: A | Discharge status: Home / Self Care | Payer: Medicaid Other | Source: Ambulatory Visit | Attending: Obstetrics and Gynecology | Admitting: Obstetrics and Gynecology

## 2018-10-22 ENCOUNTER — Encounter: Payer: Self-pay | Admitting: Obstetrics and Gynecology

## 2018-10-22 VITALS — BP 100/70 | Ht 62.0 in | Wt 137.0 lb

## 2018-10-22 DIAGNOSIS — Z30011 Encounter for initial prescription of contraceptive pills: Secondary | ICD-10-CM | POA: Diagnosis not present

## 2018-10-22 DIAGNOSIS — R894 Abnormal immunological findings in specimens from other organs, systems and tissues: Secondary | ICD-10-CM

## 2018-10-22 DIAGNOSIS — Z832 Family history of diseases of the blood and blood-forming organs and certain disorders involving the immune mechanism: Secondary | ICD-10-CM

## 2018-10-22 DIAGNOSIS — Z113 Encounter for screening for infections with a predominantly sexual mode of transmission: Secondary | ICD-10-CM | POA: Diagnosis present

## 2018-10-22 HISTORY — DX: Abnormal immunological findings in specimens from other organs, systems and tissues: R89.4

## 2018-10-22 MED ORDER — NORETHIN ACE-ETH ESTRAD-FE 1-20 MG-MCG(24) PO TABS: 1.0000 | ORAL_TABLET | Freq: Every day | ORAL | 0 refills | Status: DC

## 2018-10-22 NOTE — Patient Instructions (Signed)
I value your feedback and entrusting us with your care. If you get a [place] patient survey, I would appreciate you taking the time to let us know about your experience today. Thank you! 

## 2018-10-22 NOTE — Progress Notes (Signed)
Sherry Carrow, DO   Chief Complaint  Patient presents with  . STD Screening  . Contraception    wants BC    HPI:      Sherry Nunez is a 23 y.o. G1P1001 who LMP was Patient's last menstrual period was 10/19/2018 (exact date)., presents today for STD testing and OCP restart. Neg STD testing 01/10/18. No new sex partners since but recent guy is accusing her of giving him STD even though they never had sexual intercourse. Pt wants to be sure. No vag sx, no known exposures. No hx of STDs in past. No current sex activity.  Would also like to restart OCPs for cycle control. Menses are monthly, lasting 3-4 days, no BTB, mod dysmen, no meds taken. Pt on several OCPs in past and would like to restart. Hx of LTO pain, possible endometriosis in past. Sx improved compared to post partum. Pt with FH clotting disorder (she thinks 7 and 2--pt states very rare but ok for her to take OCPs if positive per hematologist in past) and would now like to be tested. No personal hx of clots but notes easy bruising recently. No issues with DVTs during pregnancy.  Annual due 8/20.  Past Medical History:  Diagnosis Date  . Abdominal pain   . Allergy   . Asthma   . Coagulation defect (HCC)   . Irregular menstrual cycle   . Ovarian cyst     Past Surgical History:  Procedure Laterality Date  . OVARIAN CYST REMOVAL      Family History  Problem Relation Age of Onset  . GER disease Maternal Uncle     Social History   Socioeconomic History  . Marital status: Single    Spouse name: Not on file  . Number of children: Not on file  . Years of education: Not on file  . Highest education level: Not on file  Occupational History  . Not on file  Social Needs  . Financial resource strain: Not on file  . Food insecurity:    Worry: Not on file    Inability: Not on file  . Transportation needs:    Medical: Not on file    Non-medical: Not on file  Tobacco Use  . Smoking status: Current Every Day  Smoker    Packs/day: 0.30    Types: Cigarettes  . Smokeless tobacco: Never Used  Substance and Sexual Activity  . Alcohol use: Yes    Comment: occ  . Drug use: No    Comment: Former/quit 09/2016  . Sexual activity: Yes    Birth control/protection: None  Lifestyle  . Physical activity:    Days per week: Not on file    Minutes per session: Not on file  . Stress: Not on file  Relationships  . Social connections:    Talks on phone: Not on file    Gets together: Not on file    Attends religious service: Not on file    Active member of club or organization: Not on file    Attends meetings of clubs or organizations: Not on file    Relationship status: Not on file  . Intimate partner violence:    Fear of current or ex partner: Not on file    Emotionally abused: Not on file    Physically abused: Not on file    Forced sexual activity: Not on file  Other Topics Concern  . Not on file  Social History Narrative  12th grade    Outpatient Medications Prior to Visit  Medication Sig Dispense Refill  . azithromycin (ZITHROMAX) 250 MG tablet Take 1 tablet (250 mg total) by mouth daily. (Patient not taking: Reported on 01/10/2018) 4 each 0  . Elagolix Sodium (ORILISSA) 150 MG TABS Take 1 tablet by mouth daily. 30 tablet 5  . fluticasone (FLONASE) 50 MCG/ACT nasal spray Place 2 sprays into both nostrils daily. 1 g 0   No facility-administered medications prior to visit.       ROS:  Review of Systems  Constitutional: Negative for fatigue, fever and unexpected weight change.  Respiratory: Negative for cough, shortness of breath and wheezing.   Cardiovascular: Negative for chest pain, palpitations and leg swelling.  Gastrointestinal: Negative for blood in stool, constipation, diarrhea, nausea and vomiting.  Endocrine: Negative for cold intolerance, heat intolerance and polyuria.  Genitourinary: Negative for dyspareunia, dysuria, flank pain, frequency, genital sores, hematuria, menstrual  problem, pelvic pain, urgency, vaginal bleeding, vaginal discharge and vaginal pain.  Musculoskeletal: Negative for back pain, joint swelling and myalgias.  Skin: Negative for rash.  Neurological: Negative for dizziness, syncope, light-headedness, numbness and headaches.  Hematological: Negative for adenopathy. Bruises/bleeds easily.  Psychiatric/Behavioral: Positive for agitation. Negative for confusion, sleep disturbance and suicidal ideas. The patient is not nervous/anxious.   BREAST: No symptoms   OBJECTIVE:   Vitals:  BP 100/70   Ht 5\' 2"  (1.575 m)   Wt 137 lb (62.1 kg)   LMP 10/19/2018 (Exact Date)   BMI 25.06 kg/m   Physical Exam Vitals signs reviewed.  Constitutional:      Appearance: She is well-developed.  Neck:     Musculoskeletal: Normal range of motion.  Pulmonary:     Effort: Pulmonary effort is normal.  Genitourinary:    General: Normal vulva.     Pubic Area: No rash.      Labia:        Right: No rash, tenderness or lesion.        Left: No rash, tenderness or lesion.      Vagina: Normal. No vaginal discharge, erythema or tenderness.     Cervix: Normal.     Uterus: Normal. Tender. Not enlarged.      Adnexa:        Right: Tenderness present. No mass.         Left: Tenderness present. No mass.    Musculoskeletal: Normal range of motion.  Skin:    General: Skin is warm and dry.  Neurological:     General: No focal deficit present.     Mental Status: She is alert and oriented to person, place, and time.  Psychiatric:        Mood and Affect: Mood normal.        Behavior: Behavior normal.        Thought Content: Thought content normal.        Judgment: Judgment normal.     Assessment/Plan: Screening for STD (sexually transmitted disease) - STD testing. Will f/u with results.  - Plan: Cervicovaginal ancillary only, HIV Antibody (routine testing w rflx), RPR, HSV 2 antibody, IgG, Hepatitis C antibody  Encounter for initial prescription of contraceptive  pills - OCP start today. Rx [facility]om[facility]. Condoms for 1 mo. F/u prn.  - Plan: Norethindrone Acetate-Ethinyl Estrad-FE (MICROGESTIN 24 FE) 1-20 MG-MCG(24) tablet  Family history of clotting disorder - Pt to clarify which disorder and labs can be ordered.     Meds ordered this encounter  Medications  .  Norethindrone Acetate-Ethinyl Estrad-FE (MICROGESTIN 24 FE) 1-20 MG-MCG(24) tablet    Sig: Take 1 tablet by mouth daily.    Dispense:  84 tablet    Refill:  0    Order Specific Question:   Supervising Provider    Answer:   Nadara Mustard [[id number]]      Return in about 3 months (around 01/22/2019), or if symptoms worsen or fail to improve, for annual with AS.  Sherry B. Copland, PA-C 10/22/2018 2:36 PM

## 2018-10-24 LAB — CERVICOVAGINAL ANCILLARY ONLY
Chlamydia: NEGATIVE
Neisseria Gonorrhea: NEGATIVE
Trichomonas: NEGATIVE

## 2018-10-24 LAB — HSV 2 ANTIBODY, IGG: HSV 2 IgG, Type Spec: 8.26 index — ABNORMAL HIGH (ref 0.00–0.90)

## 2018-10-24 LAB — RPR: RPR Ser Ql: NONREACTIVE

## 2018-10-24 LAB — HIV ANTIBODY (ROUTINE TESTING W REFLEX): HIV Screen 4th Generation wRfx: NONREACTIVE

## 2018-10-24 LAB — HEPATITIS C ANTIBODY: Hep C Virus Ab: 0.2 s/co ratio (ref 0.0–0.9)

## 2018-10-25 ENCOUNTER — Telehealth: Payer: Self-pay | Admitting: Obstetrics and Gynecology

## 2018-10-25 ENCOUNTER — Encounter: Payer: Self-pay | Admitting: Obstetrics and Gynecology

## 2018-10-25 NOTE — Telephone Encounter (Signed)
[facility] re: STD testing.

## 2018-10-25 NOTE — Telephone Encounter (Signed)
Pt called requesting a call back.  [phone]

## 2018-10-29 NOTE — Telephone Encounter (Signed)
Patient is calling for results. Please call [phone]

## 2018-10-29 NOTE — Telephone Encounter (Signed)
Pt aware.

## 2018-11-23 ENCOUNTER — Encounter: Payer: Self-pay | Admitting: Emergency Medicine

## 2018-11-23 ENCOUNTER — Other Ambulatory Visit: Payer: Self-pay

## 2018-11-23 ENCOUNTER — Emergency Department
Admission: EM | Admit: 2018-11-23 | Discharge: 2018-11-23 | Disposition: A | Discharge status: Home / Self Care | Payer: Medicaid Other | Attending: Emergency Medicine | Admitting: Emergency Medicine

## 2018-11-23 DIAGNOSIS — J45909 Unspecified asthma, uncomplicated: Secondary | ICD-10-CM | POA: Diagnosis not present

## 2018-11-23 DIAGNOSIS — F17210 Nicotine dependence, cigarettes, uncomplicated: Secondary | ICD-10-CM | POA: Insufficient documentation

## 2018-11-23 DIAGNOSIS — N939 Abnormal uterine and vaginal bleeding, unspecified: Secondary | ICD-10-CM | POA: Diagnosis not present

## 2018-11-23 DIAGNOSIS — Z79899 Other long term (current) drug therapy: Secondary | ICD-10-CM | POA: Insufficient documentation

## 2018-11-23 LAB — CBC WITH DIFFERENTIAL/PLATELET
Abs Immature Granulocytes: 0.05 K/uL (ref 0.00–0.07)
Basophils Absolute: 0.1 K/uL (ref 0.0–0.1)
Basophils Relative: 1 %
Eosinophils Absolute: 0.6 K/uL — ABNORMAL HIGH (ref 0.0–0.5)
Eosinophils Relative: 6 %
HCT: 41.5 % (ref 36.0–46.0)
Hemoglobin: 14 g/dL (ref 12.0–15.0)
Immature Granulocytes: 1 %
Lymphocytes Relative: 23 %
Lymphs Abs: 2 K/uL (ref 0.7–4.0)
MCH: 31.6 pg (ref 26.0–34.0)
MCHC: 33.7 g/dL (ref 30.0–36.0)
MCV: 93.7 fL (ref 80.0–100.0)
Monocytes Absolute: 0.8 K/uL (ref 0.1–1.0)
Monocytes Relative: 8 %
Neutro Abs: 5.5 K/uL (ref 1.7–7.7)
Neutrophils Relative %: 61 %
Platelets: 335 K/uL (ref 150–400)
RBC: 4.43 MIL/uL (ref 3.87–5.11)
RDW: 12.7 % (ref 11.5–15.5)
WBC: 9 K/uL (ref 4.0–10.5)
nRBC: 0 % (ref 0.0–0.2)

## 2018-11-23 LAB — BASIC METABOLIC PANEL WITH GFR
Anion gap: 8 (ref 5–15)
BUN: 10 mg/dL (ref 6–20)
CO2: 25 mmol/L (ref 22–32)
Calcium: 9.1 mg/dL (ref 8.9–10.3)
Chloride: 105 mmol/L (ref 98–111)
Creatinine, Ser: 0.8 mg/dL (ref 0.44–1.00)
GFR calc Af Amer: >60 mL/min
GFR calc non Af Amer: >60 mL/min
Glucose, Bld: 76 mg/dL (ref 70–99)
Potassium: 4 mmol/L (ref 3.5–5.1)
Sodium: 138 mmol/L (ref 135–145)

## 2018-11-23 LAB — POC URINE PREG, ED: Preg Test, Ur: NEGATIVE

## 2018-11-23 NOTE — ED Notes (Signed)
Pt ambulatory to treatment bed. NAD noted at this time.

## 2018-11-23 NOTE — ED Notes (Signed)
Reference triage note. Pt ambulatory in ED room. Pt skin color wnl. Pt denies dizziness or other neuro symptoms at this time. Pt in NAD.

## 2018-11-23 NOTE — ED Triage Notes (Signed)
Pt to ED via POV c/o vaginal bleeding since 6/13. Pt states that her OBGYN is Fraser Din at [facility], Pt states that the her flow is heavier than normal. Pt states that she is also passing blood clots which is not normal for her. Pt states that she has used 14 tampons in the last 24 hours. Pt is in NAD.

## 2018-11-23 NOTE — ED Provider Notes (Signed)
[facility] Emergency Department Provider Note   ____________________________________________    I have reviewed the triage vital signs and the nursing notes.   HISTORY  Chief Complaint Vaginal Bleeding     HPI Windy CannyKayli B Student is a 23 y.o. female who presents with vaginal bleeding.  Patient reports irregular vaginal bleeding over the last month.  She notes intermittent heavy vaginal bleeding, denies abdominal pain.  No dizziness.  Presents today because she has had to need vaginal bleeding with occasional clots.  Saw her OB in early June, who recommended starting on OCPs however she did not do this.   Past Medical History:  Diagnosis Date  . Abdominal pain   . Allergy   . Asthma   . Coagulation defect (HCC)   . Irregular menstrual cycle   . Ovarian cyst   . 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 Family history of clotting disorder 10/22/2018  . 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 Lumbago 10/14/2016  . Hypothyroid 08/25/2016  . History of trichomoniasis 08/25/2016  . BMI 34.0-34.9,adult 08/25/2016  . Asthma 07/08/2015  . Allergic rhinitis 07/08/2015  . Simple constipation 02/13/2012  . Generalized abdominal pain   . Right ovarian cyst     Past Surgical History:  Procedure Laterality Date  . OVARIAN CYST REMOVAL      Prior to Admission medications   Medication Sig Start Date End Date Taking? Authorizing Provider  Norethindrone Acetate-Ethinyl Estrad-FE (MICROGESTIN 24 FE) 1-20 MG-MCG(24) tablet Take 1 tablet by mouth daily. 10/22/18   Copland, Ilona SorrelAlicia B, PA-C     Allergies Penicillins  Family History  Problem Relation Age of Onset  . GER disease Maternal Uncle     Social History Social History   Tobacco Use  . Smoking status: Current Every Day Smoker    Packs/day: 0.30    Types: Cigarettes  . Smokeless tobacco: Never Used   Substance Use Topics  . Alcohol use: Yes    Comment: occ  . Drug use: No    Comment: Former/quit 09/2016    Review of Systems  Constitutional: No fever/chills Eyes: No visual changes.  ENT: No sore throat. Cardiovascular: Denies chest pain. Respiratory: Denies shortness of breath. Gastrointestinal: No abdominal pain.   Genitourinary: As above Musculoskeletal: Negative for back pain. Skin: Negative for rash. Neurological: Negative for headaches or weakness   ____________________________________________   PHYSICAL EXAM:  VITAL SIGNS: ED Triage Vitals [11/23/18 1700]  Enc Vitals Group     BP      Pulse Rate 88     Resp 16     Temp 98.9 F (37.2 C)     Temp src      SpO2 99 %     Weight      Height      Head Circumference      Peak Flow      Pain Score 0     Pain Loc      Pain Edu?      Excl. in GC?     Constitutional: Alert and oriented.   Mouth/Throat: Mucous membranes are moist.    Cardiovascular: Normal rate, regular rhythm. Grossly normal heart sounds.  Good peripheral circulation. Respiratory: Normal respiratory effort.  No retractions. Gastrointestinal: Soft and nontender. No distention. Genitourinary: deferred for specialist at patient request  musculoskeletal: No lower extremity tenderness nor edema.  Warm and well perfused Neurologic:  Normal speech and language. No gross focal neurologic deficits are appreciated.  Skin:  Skin is warm, dry and intact. No rash noted. Psychiatric: Mood and affect are normal. Speech and behavior are normal.  ____________________________________________   LABS (all labs ordered are listed, but only abnormal results are displayed)  Labs Reviewed  CBC WITH DIFFERENTIAL/PLATELET - Abnormal; Notable for the following components:      Result Value   Eosinophils Absolute 0.6 (*)    All other components within normal limits  BASIC METABOLIC PANEL  POC URINE PREG, ED   ____________________________________________   EKG  None ____________________________________________  RADIOLOGY  None ____________________________________________   PROCEDURES  Procedure(s) performed: No  Procedures   Critical Care performed: No ____________________________________________   INITIAL IMPRESSION / ASSESSMENT AND PLAN / ED COURSE  Pertinent labs & imaging results that were available during my care of the patient were reviewed by me and considered in my medical decision making (see chart for details).  Patient presents with dysfunctional uterine bleeding, hemoglobin is 14, vital signs are unremarkable.  Recent pelvic exam by OB/GYN unremarkable.  Discussed with Dr. Kenton Kingfisher of OB/GYN, he recommends starting OCPs as prescribed, outpatient follow-up as needed, discussed with patient she agrees with this plan.    ____________________________________________   FINAL CLINICAL IMPRESSION(S) / ED DIAGNOSES  Final diagnoses:  Abnormal vaginal bleeding        Note:  This document was prepared using Dragon voice recognition software and may include unintentional dictation errors.   Lavonia Drafts, MD 11/23/18 [phone]

## 2018-12-28 NOTE — Progress Notes (Deleted)
    Sherry Maple, MD   No chief complaint on file.   HPI:      Ms. Sherry Nunez is a 23 y.o. G1P1001 who LMP was No LMP recorded. (Menstrual status: Irregular Periods)., presents today for ***    Past Medical History:  Diagnosis Date  . Abdominal pain   . Allergy   . Asthma   . Coagulation defect ([place])   . Irregular menstrual cycle   . Ovarian cyst   . 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 OVARIAN CYST REMOVAL      Family History  Problem Relation Age of Onset  . GER disease Maternal Uncle     Social History   Socioeconomic History  . Marital status: Single    Spouse name: Not on file  . Number of children: Not on file  . Years of education: Not on file  . Highest education level: Not on file  Occupational History  . Not on file  Social Needs  . Financial resource strain: Not on file  . Food insecurity    Worry: Not on file    Inability: Not on file  . Transportation needs    Medical: Not on file    Non-medical: Not on file  Tobacco Use  . Smoking status: Current Every Day Smoker    Packs/day: 0.30    Types: Cigarettes  . Smokeless tobacco: Never Used  Substance and Sexual Activity  . Alcohol use: Yes    Comment: occ  . Drug use: No    Comment: Former/quit 09/2016  . Sexual activity: Yes    Birth control/protection: None  Lifestyle  . Physical activity    Days per week: Not on file    Minutes per session: Not on file  . Stress: Not on file  Relationships  . Social Herbalist on phone: Not on file    Gets together: Not on file    Attends religious service: Not on file    Active member of club or organization: Not on file    Attends meetings of clubs or organizations: Not on file    Relationship status: Not on file  . Intimate partner violence    Fear of current or ex partner: Not on file    Emotionally abused: Not on file   Physically abused: Not on file    Forced sexual activity: Not on file  Other Topics Concern  . Not on file  Social History Narrative   12th grade    Outpatient Medications Prior to Visit  Medication Sig Dispense Refill  . Norethindrone Acetate-Ethinyl Estrad-FE (MICROGESTIN 24 FE) 1-20 MG-MCG(24) tablet Take 1 tablet by mouth daily. 84 tablet 0   No facility-administered medications prior to visit.       ROS:  Review of Systems BREAST: No symptoms   OBJECTIVE:   Vitals:  There were no vitals taken for this visit.  Physical Exam  Results: No results found for this or any previous visit (from the past 24 hour(s)).   Assessment/Plan: No diagnosis found.    No orders of the defined types were placed in this encounter.     No follow-ups on file.  Alicia B. Copland, PA-C 12/28/2018 10:48 AM

## 2018-12-31 ENCOUNTER — Ambulatory Visit: Payer: Medicaid Other | Admitting: Obstetrics and Gynecology

## 2018-12-31 ENCOUNTER — Encounter: Payer: Self-pay | Admitting: Certified Nurse Midwife

## 2018-12-31 ENCOUNTER — Other Ambulatory Visit (HOSPITAL_COMMUNITY)
Admission: RE | Admit: 2018-12-31 | Discharge: 2018-12-31 | Disposition: A | Discharge status: Home / Self Care | Payer: Medicaid Other | Source: Ambulatory Visit | Attending: Certified Nurse Midwife | Admitting: Certified Nurse Midwife

## 2018-12-31 ENCOUNTER — Other Ambulatory Visit: Payer: Self-pay

## 2018-12-31 ENCOUNTER — Ambulatory Visit (INDEPENDENT_AMBULATORY_CARE_PROVIDER_SITE_OTHER): Payer: Medicaid Other | Admitting: Certified Nurse Midwife

## 2018-12-31 VITALS — BP 100/70 | HR 96 | Temp 99.3°F | Ht 62.0 in | Wt 136.6 lb

## 2018-12-31 DIAGNOSIS — N83201 Unspecified ovarian cyst, right side: Secondary | ICD-10-CM

## 2018-12-31 DIAGNOSIS — R1030 Lower abdominal pain, unspecified: Secondary | ICD-10-CM | POA: Diagnosis not present

## 2018-12-31 DIAGNOSIS — R102 Pelvic and perineal pain: Secondary | ICD-10-CM

## 2018-12-31 DIAGNOSIS — N9489 Other specified conditions associated with female genital organs and menstrual cycle: Secondary | ICD-10-CM

## 2018-12-31 DIAGNOSIS — Z113 Encounter for screening for infections with a predominantly sexual mode of transmission: Secondary | ICD-10-CM

## 2018-12-31 DIAGNOSIS — M545 Low back pain, unspecified: Secondary | ICD-10-CM

## 2018-12-31 DIAGNOSIS — Z3202 Encounter for pregnancy test, result negative: Secondary | ICD-10-CM | POA: Diagnosis not present

## 2018-12-31 LAB — POCT URINALYSIS DIPSTICK
Bilirubin, UA: NEGATIVE
Blood, UA: NEGATIVE
Glucose, UA: NEGATIVE
Nitrite, UA: NEGATIVE
Protein, UA: POSITIVE — AB
Spec Grav, UA: 1.01 (ref 1.010–1.025)
Urobilinogen, UA: NEGATIVE E.U./dL — AB
pH, UA: 8 (ref 5.0–8.0)

## 2018-12-31 MED ORDER — METRONIDAZOLE 500 MG PO TABS: 500.0000 mg | ORAL_TABLET | Freq: Two times a day (BID) | ORAL | 0 refills | Status: AC

## 2018-12-31 MED ORDER — DOXYCYCLINE HYCLATE 100 MG PO TABS: 100.0000 mg | ORAL_TABLET | Freq: Two times a day (BID) | ORAL | 0 refills | Status: DC

## 2018-12-31 MED ORDER — IBUPROFEN 600 MG PO TABS: 600.0000 mg | ORAL_TABLET | Freq: Four times a day (QID) | ORAL | 3 refills | Status: DC | PRN

## 2018-12-31 NOTE — Progress Notes (Signed)
Obstetrics & Gynecology Office Visit   Chief Complaint:  Chief Complaint  Patient presents with  . Gynecologic Exam    irreg bleeding; pain in left ovary; cysts on both ovaries; sometimes legs feel numb c the pain    History of Present Illness: Sherry Nunez is a  23 year old G1 P1001 with LMP=12/22/2018 who presents with complaints of pelvic/abdominal pain and a particularly heavy menses with this recently period. Pain is in bilateral lower abdomen, but also radiates to her upper abdomen and her back. She also reports dyspareunia. Has taken 400 mgm ibuprofen 3 days ago. "Does not like to take medicine." Has a history of having a large right ovarian cyst (cystadenoma) removed about 10 years ago and she is worried that the cyst has returned.   She has a history of irregular menses / oligomenorrhea and has been off and on OCPs in the last year. Last year she took OCPs x 3 months. Her last normal menses was in December. She had no menses until April then bled for 2 months. She restated OCPs in either June or July, and had a 4 day menses in July. Her LMP was 1-7 August and was heavy with clots. She is still having a stringy blood tinged discharge. No dysuria. Has had more frequent stools the last two day. Had 3 stools today, the last was loose. Has not taken her temperature but has felt hot and also has had chills.   Review of Systems  Constitutional: Positive for chills. Negative for fever and weight loss.  HENT: Negative for congestion, sinus pain and sore throat.   Eyes: Negative for blurred vision and pain.  Respiratory: Negative for hemoptysis, shortness of breath and wheezing.   Cardiovascular: Negative for chest pain, palpitations and leg swelling.  Gastrointestinal: Positive for abdominal pain. Negative for blood in stool, diarrhea, heartburn, nausea and vomiting.       More frequent stools and last one loose  Genitourinary: Negative for dysuria, frequency, hematuria and urgency.   Musculoskeletal: Positive for back pain. Negative for joint pain and myalgias.  Skin: Negative for itching and rash.  Neurological: Negative for dizziness, tingling and headaches.  Endo/Heme/Allergies: Negative for environmental allergies and polydipsia. Does not bruise/bleed easily.       Negative for hirsutism   Psychiatric/Behavioral: Negative for depression. The patient is nervous/anxious. The patient does not have insomnia.      Past Medical History:  Past Medical History:  Diagnosis Date  . Abdominal pain   . Allergy   . Asthma   . Coagulation defect (HCC)   . Irregular menstrual cycle   . Ovarian cyst 06/08/2009   benign cystadenoma right ovary  . 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 OVARIAN CYST REMOVAL Right 06/08/2009   benign cystadenoma    Gynecologic History: Patient's last menstrual period was 12/22/2018.  Obstetric History: G1P1001 OB History  Gravida Para Term Preterm AB Living  1 1 1     1   SAB TAB Ectopic Multiple Live Births        0 1    # Outcome Date GA Lbr Len/2nd Weight Sex Delivery Anes PTL Lv  1 Term 10/19/16 [id number] / 00:49 7 lb 6.9 oz (3.37 kg) F Vag-Spont EPI  LIV   Family History:  Family History  Problem Relation Age of Onset  .  GER disease Maternal Uncle     Social History:  Social History   Socioeconomic History  . Marital status: Single    Spouse name: Not on file  . Number of children: 1  . Years of education: Not on file  . Highest education level: Not on file  Occupational History  . Not on file  Social Needs  . Financial resource strain: Not on file  . Food insecurity    Worry: Not on file    Inability: Not on file  . Transportation needs    Medical: Not on file    Non-medical: Not on file  Tobacco Use  . Smoking status: Current Every Day Smoker    Packs/day: 0.30    Types: Cigarettes  .  Smokeless tobacco: Never Used  . Tobacco comment: 1pk lasts a week  Substance and Sexual Activity  . Alcohol use: Yes    Comment: occ  . Drug use: No    Comment: Former/quit 09/2016  . Sexual activity: Yes    Birth control/protection: None, Condom  Lifestyle  . Physical activity    Days per week: Not on file    Minutes per session: Not on file  . Stress: Not on file  Relationships  . Social Musicianconnections    Talks on phone: Not on file    Gets together: Not on file    Attends religious service: Not on file    Active member of club or organization: Not on file    Attends meetings of clubs or organizations: Not on file    Relationship status: Not on file  . Intimate partner violence    Fear of current or ex partner: Not on file    Emotionally abused: Not on file    Physically abused: Not on file    Forced sexual activity: Not on file  Other Topics Concern  . Not on file  Social History Narrative   12th grade    Allergies:  Allergies  Allergen Reactions  . Penicillins Anaphylaxis    Medications:  Current Outpatient Medications on File Prior to Visit  Medication Sig Dispense Refill  . Norethindrone Acetate-Ethinyl Estrad-FE (MICROGESTIN 24 FE) 1-20 MG-MCG(24) tablet Take 1 tablet by mouth daily. (Patient not taking: Reported on 12/31/2018) 84 tablet 0   No current facility-administered medications on file prior to visit.    Physical Exam Vitals: BP 100/70   Pulse 96   Temp 99.3 F (37.4 C)   Ht 5\' 2"  (1.575 m)   Wt 136 lb 9.6 oz (62 kg)   LMP 12/22/2018   BMI 24.98 kg/m  Physical Exam  Vitals reviewed. Constitutional: She is oriented to person, place, and time.  WF, appears distressed, anxious, talking very fast  Respiratory: Effort normal.  GI:  Generalized tenderness, no voluntary guarding, no hepatomegaly, non distended, no masses  Genitourinary:    Genitourinary Comments: Vulva: mild erythema at introitus, several tiny raised papules on left lower labia  Vagina: creamy brownish vaginal discharge, vaginal walls tender Cervix: deviated to the right , +CMT Uterus: RF, immobile, tender, TLNS Adnexa: tender bilaterally, no masses palpated   Musculoskeletal: Normal range of motion.  Neurological: She is alert and oriented to person, place, and time.  Skin: Skin is warm and dry.  Psychiatric:  Tearful at times discussing pain, anxious regarding etiology     Source Wet Prep POC vagina    WBC, Wet Prep HPF POC many    Bacteria Wet Prep HPF POC  BACTERIA WET PREP MORPHOLOGY POC     Clue Cells Wet Prep HPF POC Many (A) None   Clue Cells Wet Prep Whiff POC     Yeast Wet Prep HPF POC None None   KOH Wet Prep POC     Trichomonas Wet Prep HPF POC Absent Absent  POCT urine pregnancy     Status: Normal   Collection Time: 01/06/19 11:40 AM  Result Value Ref Range   Preg Test, Ur Negative Negative   Urine dipstick shows trace protein, and +1 leukocytes.   Assessment: 23 y.o. G1P1001 Abdominal/pelvic pain Possible PID R/O UTI Bacterial vaginosis  Plan: Due to anaphylaxis to penicillin, will hold Rocephin and begin doxycycline 100 mgm BID Flagyl 500 mgm BID (can start after doxycycline) -no alcohol Aptima, urine culture CBC Return in 1 week for pelvic ultrasound ( patient hardly able to tolerate pelvic exam today) and follow up  Dalia Heading, CNM

## 2019-01-01 LAB — CBC WITH DIFFERENTIAL/PLATELET
Basophils Absolute: 0.1 10*3/uL (ref 0.0–0.2)
Basos: 0 %
EOS (ABSOLUTE): 0.1 10*3/uL (ref 0.0–0.4)
Eos: 1 %
Hematocrit: 33.7 % — ABNORMAL LOW (ref 34.0–46.6)
Hemoglobin: 12.1 g/dL (ref 11.1–15.9)
Immature Grans (Abs): 0.1 10*3/uL (ref 0.0–0.1)
Immature Granulocytes: 1 %
Lymphocytes Absolute: 1.7 10*3/uL (ref 0.7–3.1)
Lymphs: 9 %
MCH: 32.1 pg (ref 26.6–33.0)
MCHC: 35.9 g/dL — ABNORMAL HIGH (ref 31.5–35.7)
MCV: 89 fL (ref 79–97)
Monocytes Absolute: 1.4 10*3/uL — ABNORMAL HIGH (ref 0.1–0.9)
Monocytes: 7 %
Neutrophils Absolute: 16.2 10*3/uL — ABNORMAL HIGH (ref 1.4–7.0)
Neutrophils: 82 %
Platelets: 417 10*3/uL (ref 150–450)
RBC: 3.77 x10E6/uL (ref 3.77–5.28)
RDW: 11.4 % — ABNORMAL LOW (ref 11.7–15.4)
WBC: 19.6 10*3/uL — ABNORMAL HIGH (ref 3.4–10.8)

## 2019-01-03 LAB — CERVICOVAGINAL ANCILLARY ONLY
Chlamydia: NEGATIVE
Neisseria Gonorrhea: POSITIVE — AB
Trichomonas: NEGATIVE

## 2019-01-03 LAB — URINE CULTURE

## 2019-01-06 ENCOUNTER — Encounter: Payer: Self-pay | Admitting: Certified Nurse Midwife

## 2019-01-06 LAB — POCT WET PREP (WET MOUNT): Trichomonas Wet Prep HPF POC: ABSENT

## 2019-01-06 LAB — POCT URINE PREGNANCY: Preg Test, Ur: NEGATIVE

## 2019-01-07 ENCOUNTER — Encounter: Payer: Self-pay | Admitting: Certified Nurse Midwife

## 2019-01-07 ENCOUNTER — Telehealth: Payer: Self-pay | Admitting: Certified Nurse Midwife

## 2019-01-07 MED ORDER — GEMIFLOXACIN MESYLATE 320 MG PO TABS: ORAL_TABLET | ORAL | 0 refills | Status: DC

## 2019-01-07 MED ORDER — AZITHROMYCIN 500 MG PO TABS: ORAL_TABLET | ORAL | 0 refills | Status: DC

## 2019-01-07 NOTE — Telephone Encounter (Signed)
Patient called and she states she is feeling better. Advised that gonorrhea test came back positive and that all sexual partners need to also be treated. Still taking doxycycline. Rx for gemifloxacin 320 mgm x 1 dose and 2 GM Azithromycin called in to [facility]. Has follow up appointment scheduled for 20 August for ultrasound also.   Dalia Heading, CNM

## 2019-01-10 ENCOUNTER — Other Ambulatory Visit: Payer: Medicaid Other

## 2019-01-10 ENCOUNTER — Ambulatory Visit: Payer: Medicaid Other | Admitting: Certified Nurse Midwife

## 2019-01-12 ENCOUNTER — Telehealth: Payer: Self-pay | Admitting: Certified Nurse Midwife

## 2019-01-12 ENCOUNTER — Other Ambulatory Visit: Payer: Self-pay | Admitting: Certified Nurse Midwife

## 2019-01-12 NOTE — Telephone Encounter (Signed)
I called [place] to let her know that Medicaid denied coverage for gemifloxacin. She is feeling better and no longer has abdominal pain. She had to reschedule her follow up appointment from 8/20 to 8/31. Advised her that she has several options: 1) See if azithromycin 2 GM has cured the gonorrhea. We can retest for gonorrhea. 2) She can pay for gemifloxacin out of pocket, which would cost around $50. 3) She can be admitted for observation to the hospital and given an injection of gentamycin 240 mgm IM x 1 . She would like option #3, but can not do this until Tuesday August 25. Will make arrangements for this  (wo. uld like to do after 5 PM)  She also reports that one of her sexual partners did test positive for gonorrhea and was treated. The other partner who used condoms was tested and was negative.   Dalia Heading, CNM

## 2019-01-14 ENCOUNTER — Other Ambulatory Visit: Payer: Self-pay | Admitting: Certified Nurse Midwife

## 2019-01-15 ENCOUNTER — Ambulatory Visit: Payer: Medicaid Other | Admitting: Certified Nurse Midwife

## 2019-01-15 ENCOUNTER — Telehealth: Payer: Self-pay | Admitting: Certified Nurse Midwife

## 2019-01-15 ENCOUNTER — Other Ambulatory Visit: Payer: Self-pay | Admitting: Certified Nurse Midwife

## 2019-01-15 MED ORDER — GENTAMICIN SULFATE 40 MG/ML IJ SOLN: 240.0000 mg | Freq: Once | INTRAMUSCULAR | 0 refills | Status: AC

## 2019-01-15 NOTE — Telephone Encounter (Signed)
On 24 August, I found out that I could obtain the Gentamycin 240 mgm dose from the [facility] and administer the medication as an outpatient in the office. Medicaid would pay for the medicne. I called Jule Ser 8/24 and made arrangements for her to pick up the medication this AM and bring with her to the office at 0810 this Am. She was called into work and did not pick up the medication or come to the appointment today. I called her now and she assured me she could come into the office tomorrow and get the injection. I advised her to pick up the medication tomorrow and to call for an injection appointment tomorrow AM ( I will be in L&D). I have talked with Cleophas Dunker, CMA and Otila Kluver, RN about giving her the injection. Will need to split the dose into 2 injections (32ml each).  Dalia Heading, CNM

## 2019-01-16 ENCOUNTER — Ambulatory Visit: Payer: Medicaid Other

## 2019-01-21 ENCOUNTER — Ambulatory Visit: Payer: Medicaid Other | Admitting: Certified Nurse Midwife

## 2019-01-21 ENCOUNTER — Ambulatory Visit: Payer: Medicaid Other

## 2019-02-06 ENCOUNTER — Telehealth: Payer: Self-pay | Admitting: Certified Nurse Midwife

## 2019-02-06 NOTE — Telephone Encounter (Signed)
Called Sherry Nunez to schedule a follow up visit for a TOC for gonorrhea. She never came in for the gentamycin injection. Took only the Azithromycin 2 Gm and the 10 day course of doxycycline (and a course of Flagyl for the BV). Tarri would like to come in at 4:20 Pm tomorrow. She was scheduled for an appointment. Dalia Heading, CNM

## 2019-02-07 ENCOUNTER — Ambulatory Visit: Payer: Medicaid Other | Admitting: Certified Nurse Midwife

## 2019-02-08 IMAGING — US US OB COMP +14 WK
1 series · 14 of 28 positions shown · non-contrast
Comparison: none

CLINICAL DATA: Antenatal screening.

EXAM:
OBSTETRICAL ULTRASOUND >14 WKS

[Series 1: us ob comp +14 wk · 0.23mm/px · 14 of 102 slices shown]
[im 4/102]
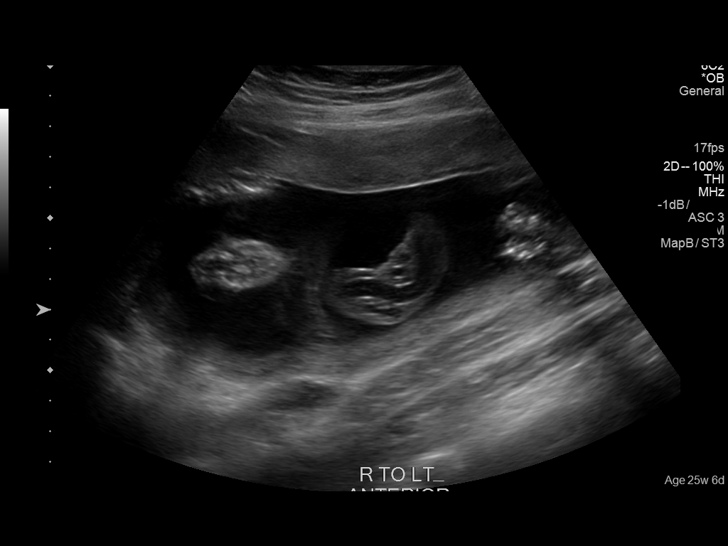
[im 12/102]
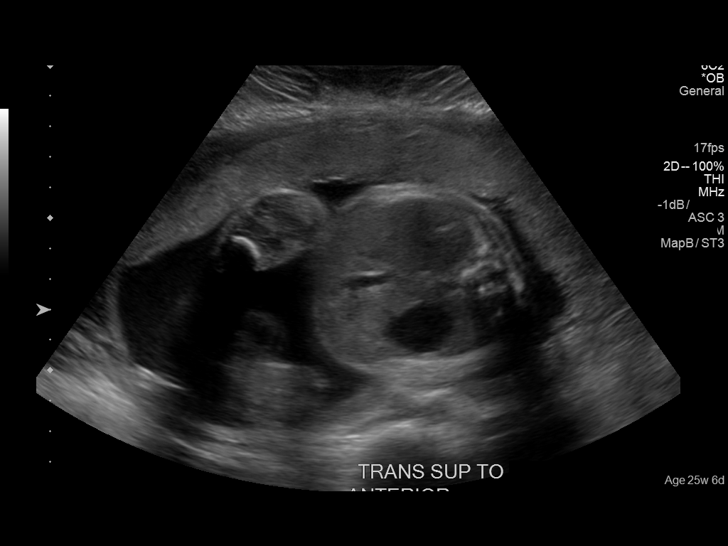
[im 19/102]
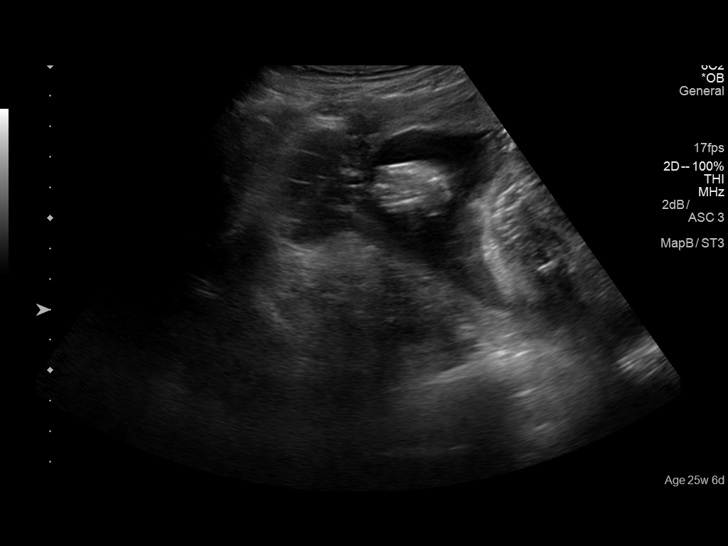
[im 27/102]
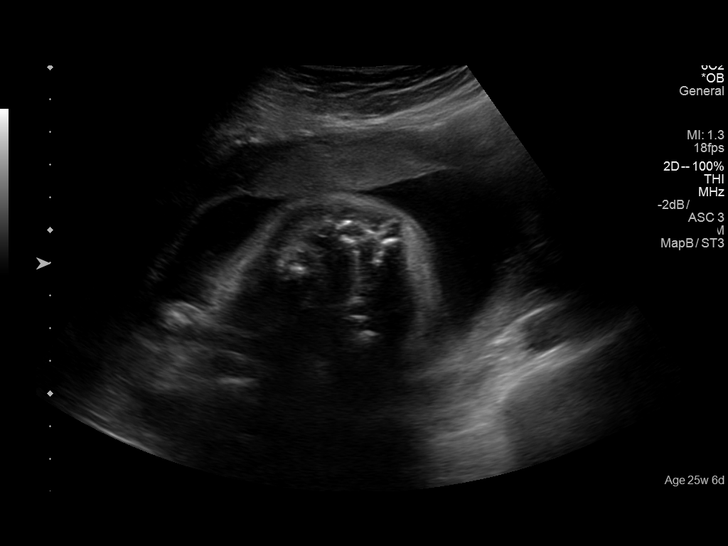
[im 34/102]
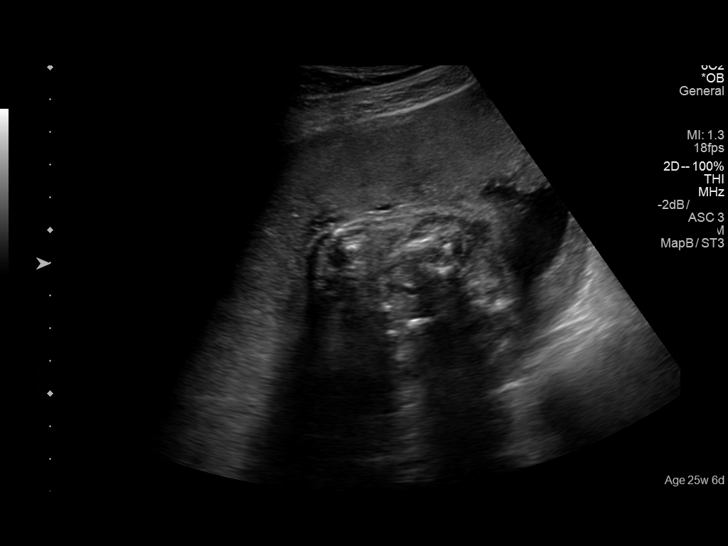
[im 42/102]
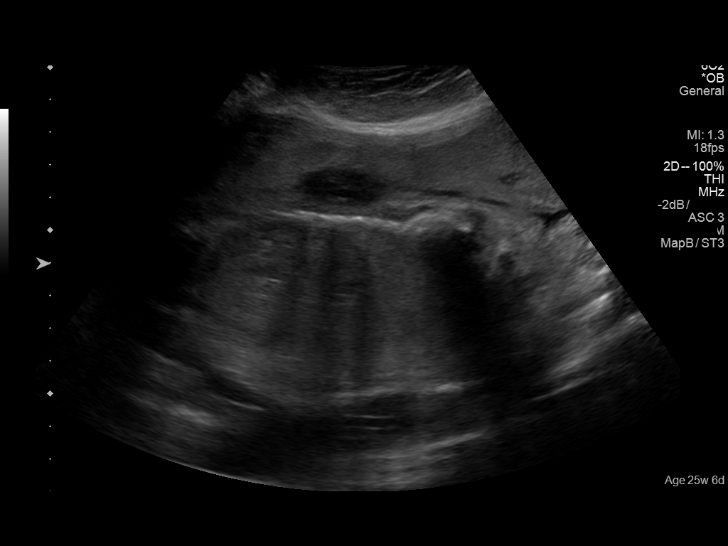
[im 49/102]
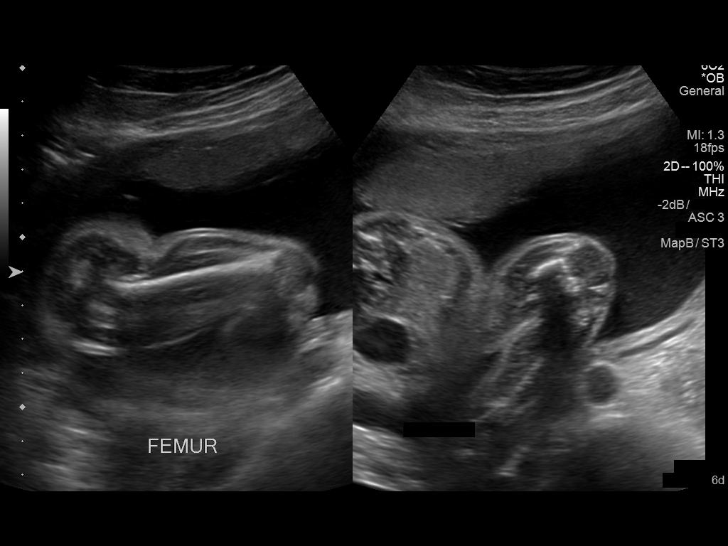
[im 57/102]
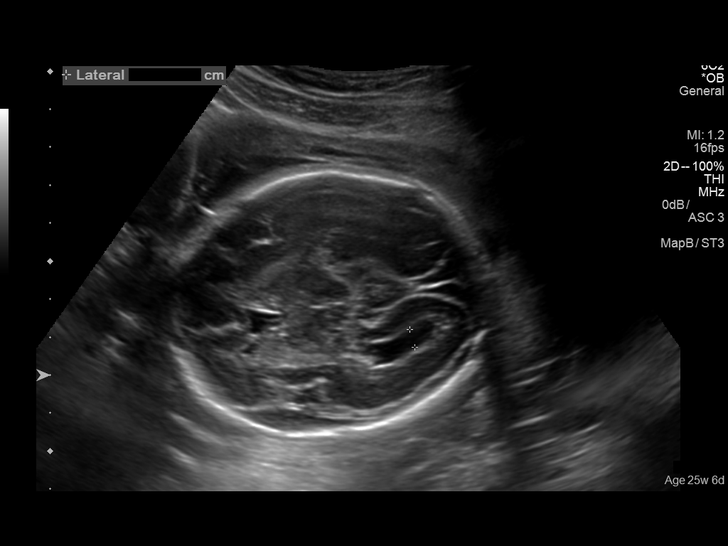
[im 64/102]
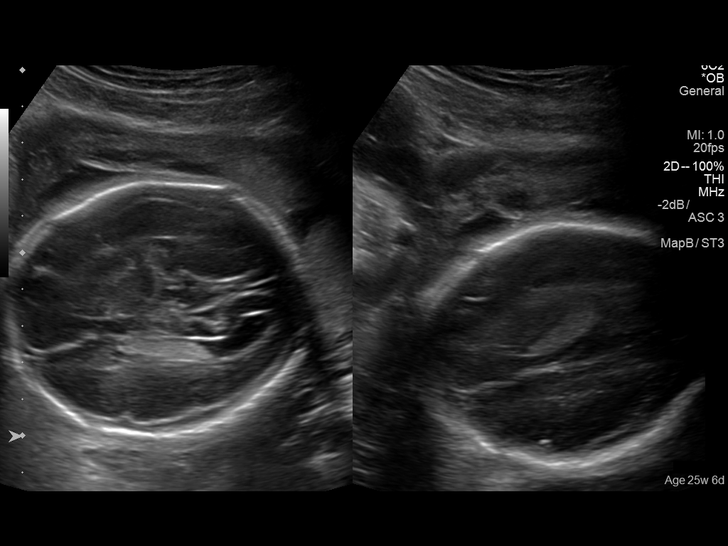
[im 72/102]
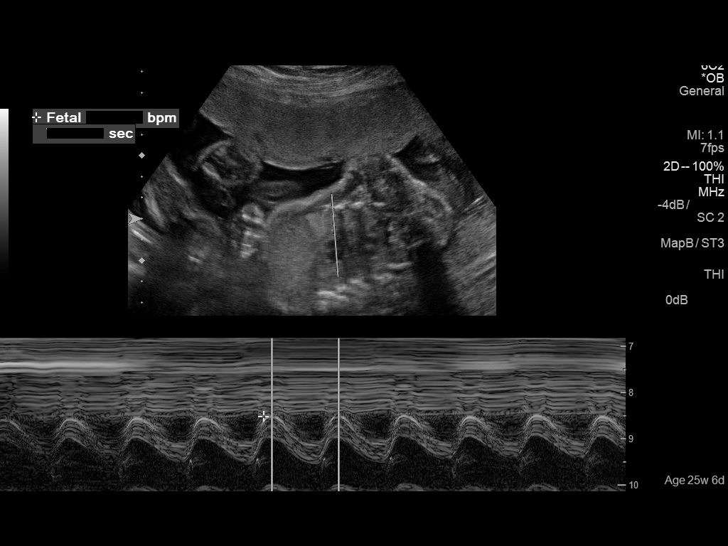
[im 79/102]
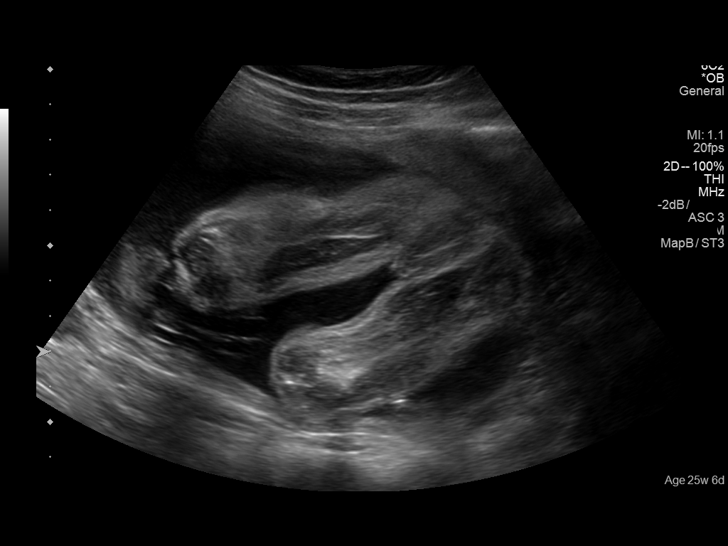
[im 87/102]
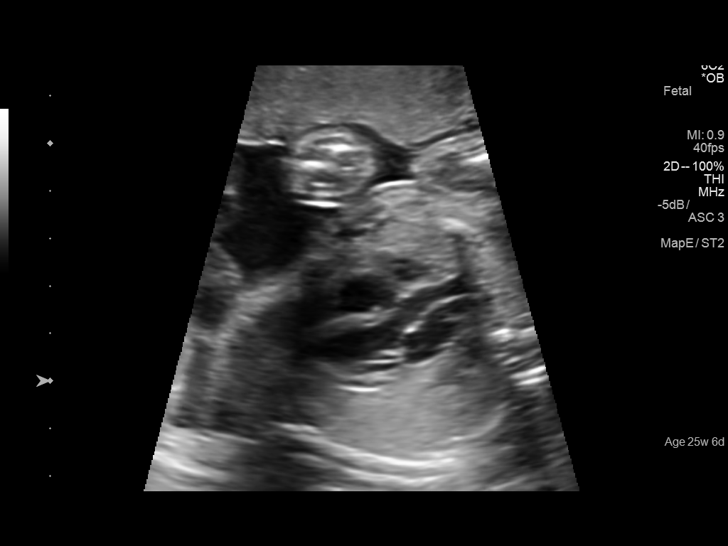
[im 94/102]
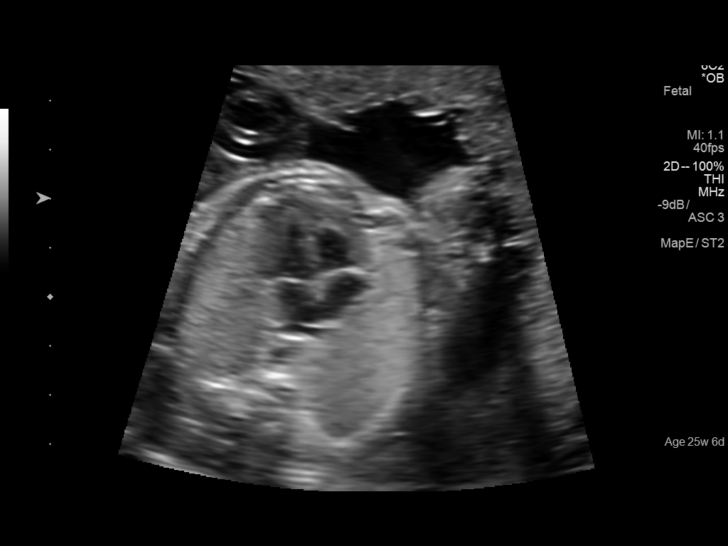
[im 102/102]
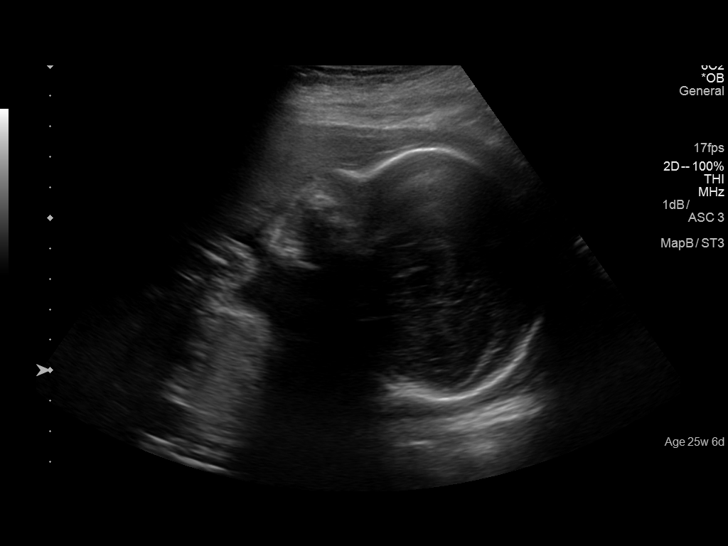

[14 of 28 positions shown; findings below may reference images not displayed]

FINDINGS: Number of Fetuses: 1

Heart Rate:  153 bpm

Movement: Present

Presentation: Cephalic

Previa: None

Placental Location: Anterior

Amniotic Fluid (Subjective): Normal

Amniotic Fluid (Objective):

Vertical pocket 5.8cm

AFI 15.2 cm

FETAL BIOMETRY

BPD:  6.8cm 27w 2d

HC:    24.1cm  26w   1d

AC:   22.4cm  26w   6d

FL:   4.9cm  26w   3d

Current Mean GA: 26w 4d              US EDC: 10/21/2016

FETAL ANATOMY

Lateral Ventricles: Visualized

Thalami/CSP: Visualized

Posterior Fossa:  Visualized

Upper Lip: Visualized

Spine: Visualized

4 Chamber Heart on Left: Visualized

LVOT: Visualized

RVOT: Visualized

Stomach on Left: Visualized

3 Vessel Cord: Visualized

Cord Insertion site: Visualized

Kidneys: Visualized

Bladder: Visualized

Extremities: Visualized

Maternal Findings:

Cervix:  3.6 cm and closed.
IMPRESSION: Single viable intrauterine pregnancy at 26 weeks 4 days.

## 2019-11-08 ENCOUNTER — Ambulatory Visit: Payer: Medicaid Other | Admitting: Obstetrics and Gynecology

## 2019-11-15 ENCOUNTER — Ambulatory Visit: Payer: Medicaid Other | Admitting: Obstetrics

## 2019-11-29 DIAGNOSIS — W208XXA Other cause of strike by thrown, projected or falling object, initial encounter: Secondary | ICD-10-CM | POA: Insufficient documentation

## 2019-11-29 DIAGNOSIS — I10 Essential (primary) hypertension: Secondary | ICD-10-CM | POA: Insufficient documentation

## 2019-11-29 DIAGNOSIS — S0993XA Unspecified injury of face, initial encounter: Secondary | ICD-10-CM | POA: Diagnosis present

## 2019-11-29 DIAGNOSIS — F17210 Nicotine dependence, cigarettes, uncomplicated: Secondary | ICD-10-CM | POA: Diagnosis not present

## 2019-11-29 DIAGNOSIS — Y9289 Other specified places as the place of occurrence of the external cause: Secondary | ICD-10-CM | POA: Diagnosis not present

## 2019-11-29 DIAGNOSIS — E039 Hypothyroidism, unspecified: Secondary | ICD-10-CM | POA: Diagnosis not present

## 2019-11-29 DIAGNOSIS — J45909 Unspecified asthma, uncomplicated: Secondary | ICD-10-CM | POA: Diagnosis not present

## 2019-11-29 DIAGNOSIS — T7411XA Adult physical abuse, confirmed, initial encounter: Secondary | ICD-10-CM | POA: Diagnosis not present

## 2019-11-29 DIAGNOSIS — Y9389 Activity, other specified: Secondary | ICD-10-CM | POA: Insufficient documentation

## 2019-11-29 DIAGNOSIS — Y999 Unspecified external cause status: Secondary | ICD-10-CM | POA: Insufficient documentation

## 2019-11-30 ENCOUNTER — Emergency Department
Admission: EM | Admit: 2019-11-30 | Discharge: 2019-11-30 | Disposition: A | Discharge status: Home / Self Care | Payer: Medicaid Other | Attending: Emergency Medicine | Admitting: Emergency Medicine

## 2019-11-30 ENCOUNTER — Emergency Department: Payer: Medicaid Other

## 2019-11-30 ENCOUNTER — Other Ambulatory Visit: Payer: Self-pay

## 2019-11-30 ENCOUNTER — Encounter: Payer: Self-pay | Admitting: *Deleted

## 2019-11-30 LAB — POCT PREGNANCY, URINE: Preg Test, Ur: NEGATIVE
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
## 2019-11-30 NOTE — ED Notes (Signed)
No peripheral IV placed this visit.   Discharge instructions reviewed with patient. Questions fielded by this RN. Patient verbalizes understanding of instructions. Patient discharged home in stable condition per Dr Don Perking. No acute distress noted at time of discharge.

## 2020-08-09 ENCOUNTER — Ambulatory Visit
Admission: EM | Admit: 2020-08-09 | Discharge: 2020-08-09 | Disposition: A | Discharge status: Home / Self Care | Payer: Medicaid Other | Attending: Sports Medicine | Admitting: Sports Medicine

## 2020-08-09 ENCOUNTER — Encounter: Payer: Self-pay | Admitting: Emergency Medicine

## 2020-08-09 ENCOUNTER — Other Ambulatory Visit: Payer: Self-pay

## 2020-08-09 DIAGNOSIS — Z20822 Contact with and (suspected) exposure to covid-19: Secondary | ICD-10-CM | POA: Insufficient documentation

## 2020-08-09 DIAGNOSIS — Z793 Long term (current) use of hormonal contraceptives: Secondary | ICD-10-CM | POA: Diagnosis not present

## 2020-08-09 DIAGNOSIS — N3090 Cystitis, unspecified without hematuria: Secondary | ICD-10-CM | POA: Diagnosis not present

## 2020-08-09 DIAGNOSIS — Z792 Long term (current) use of antibiotics: Secondary | ICD-10-CM | POA: Insufficient documentation

## 2020-08-09 DIAGNOSIS — R197 Diarrhea, unspecified: Secondary | ICD-10-CM | POA: Insufficient documentation

## 2020-08-09 DIAGNOSIS — R112 Nausea with vomiting, unspecified: Secondary | ICD-10-CM | POA: Diagnosis not present

## 2020-08-09 DIAGNOSIS — F17210 Nicotine dependence, cigarettes, uncomplicated: Secondary | ICD-10-CM | POA: Insufficient documentation

## 2020-08-09 DIAGNOSIS — Z79899 Other long term (current) drug therapy: Secondary | ICD-10-CM | POA: Insufficient documentation

## 2020-08-09 DIAGNOSIS — E860 Dehydration: Secondary | ICD-10-CM | POA: Diagnosis not present

## 2020-08-09 DIAGNOSIS — N390 Urinary tract infection, site not specified: Secondary | ICD-10-CM | POA: Diagnosis not present

## 2020-08-09 DIAGNOSIS — Z880 Allergy status to penicillin: Secondary | ICD-10-CM | POA: Insufficient documentation

## 2020-08-09 LAB — SARS CORONAVIRUS 2 (TAT 6-24 HRS): SARS Coronavirus 2: NEGATIVE

## 2020-08-09 LAB — URINALYSIS, COMPLETE (UACMP) WITH MICROSCOPIC
Glucose, UA: NEGATIVE mg/dL
Hgb urine dipstick: NEGATIVE
Nitrite: NEGATIVE
Specific Gravity, Urine: >1.03 — ABNORMAL HIGH (ref 1.005–1.030)
pH: 5.5 (ref 5.0–8.0)

## 2020-08-09 LAB — PREGNANCY, URINE: Preg Test, Ur: NEGATIVE

## 2020-08-09 MED ORDER — ONDANSETRON HCL 4 MG PO TABS
4.0000 mg | ORAL_TABLET | Freq: Four times a day (QID) | ORAL | 0 refills | Status: DC
Start: 2020-08-09 — End: 2022-10-03

## 2020-08-09 MED ORDER — NITROFURANTOIN MONOHYD MACRO 100 MG PO CAPS
100.0000 mg | ORAL_CAPSULE | Freq: Two times a day (BID) | ORAL | 0 refills | Status: DC
Start: 2020-08-09 — End: 2022-10-03

## 2020-08-09 NOTE — ED Triage Notes (Addendum)
Patient in today c/o nausea, emesis, and diarrhea x 3 days. Patient denies fever. Patient hasn't taken any OTC medications for her symptoms, but has been trying to stay hydrated. Patient denies any recent antibiotic use. Patient has not had the covid vaccines. Patient requesting covid testing.

## 2020-08-09 NOTE — Discharge Instructions (Addendum)
Your urine shows that you have a possible urinary tract infection.  I will treat you with an antibiotic.  I also sent off the culture.  Someone may call you and change the antibiotic, or they may call you and tell you to stop it. Your Covid test was pending at the time of discharge. Your urine pregnancy test was negative. Educational handouts were provided. I also provided you a work note keep you out of work today and tomorrow and you can return Tuesday, March 22. If your symptoms persist please see your primary care provider.  If they worsen in any way please go to the emergency room.  I hope you get to feeling better, Dr. Zachery Dauer

## 2020-08-10 LAB — URINE CULTURE: Culture: NO GROWTH

## 2020-08-14 NOTE — ED Provider Notes (Signed)
[facility]    CSN: [id number] Arrival date & time: 08/09/20  1123      History   Chief Complaint Chief Complaint  Patient presents with  . Emesis  . Diarrhea    HPI Sherry Nunez is a 25 y.o. female.   Patient pleasant 25 year old female who presents for evaluation of the above issue.  She reports 3 days of nausea and vomiting.  She actually went to work on Thursday after being nauseous and having several episodes of emesis.  She works over Engineer, technical sales.  Symptoms progressed into Friday.  No bile or blood in the emesis.  It was food in stomach contents.  On Saturday she started having some watery loose stools.  Again today emesis and diarrhea.  No blood in the stools.  She denies any significant abdominal pain.  No urinary symptoms.  No urinary frequency or urgency.  No history of any significant UTIs.  No history of renal stones.  No recent antibiotic use.  She has some associated fatigue.  She also reports some chills but no fever.  She has not been vaccinated against COVID.  She does report having a flu shot.  No COVID history.  No Covid exposure.  No chest pain or shortness of breath.  No red flag signs or symptoms elicited on history.     Past Medical History:  Diagnosis Date  . Abdominal pain   . Allergy   . Asthma   . Coagulation defect (HCC)   . Irregular menstrual cycle   . Ovarian cyst 06/08/2009   benign cystadenoma right ovary  . 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 Family history of clotting disorder 10/22/2018  . 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 Lumbago 10/14/2016  . Hypothyroid 08/25/2016  . History of trichomoniasis 08/25/2016  . BMI 34.0-34.9,adult 08/25/2016  . Asthma 07/08/2015  . Allergic rhinitis 07/08/2015  . Simple constipation 02/13/2012  . Generalized abdominal pain   . Right ovarian cyst     Past Surgical History:   Procedure Laterality Date  . OVARIAN CYST REMOVAL Right 06/08/2009   benign cystadenoma    OB History    Gravida  1   Para  1   Term  1   Preterm      AB      Living  1     SAB      IAB      Ectopic      Multiple  0   Live Births  1            Home Medications    Prior to Admission medications   Medication Sig Start Date End Date Taking? Authorizing Provider  nitrofurantoin, macrocrystal-monohydrate, (MACROBID) 100 MG capsule Take 1 capsule (100 mg total) by mouth 2 (two) times daily. 08/09/20  Yes Delton See, MD  ondansetron (ZOFRAN) 4 MG tablet Take 1 tablet (4 mg total) by mouth every 6 (six) hours. 08/09/20  Yes Delton See, MD  ibuprofen (ADVIL) 600 MG tablet Take 1 tablet (600 mg total) by mouth every 6 (six) hours as needed. 12/31/18   Farrel Conners, CNM  Norethindrone Acetate-Ethinyl Estrad-FE (MICROGESTIN 24 FE) 1-20 MG-MCG(24) tablet Take 1 tablet by mouth daily. Patient not taking: No sig reported 10/22/18   Copland, Ilona Sorrel, PA-C    Family History Family History  Problem Relation Age of Onset  .  Breast cancer Mother   . Bipolar disorder Mother   . Healthy Father   . GER disease Maternal Uncle     Social History Social History   Tobacco Use  . Smoking status: Current Every Day Smoker    Packs/day: 0.30    Types: Cigarettes  . Smokeless tobacco: Never Used  Vaping Use  . Vaping Use: Never used  Substance Use Topics  . Alcohol use: Not Currently    Comment: occ  . Drug use: No    Comment: Former/quit 09/2016     Allergies   Penicillins   Review of Systems Review of Systems  Constitutional: Positive for appetite change and fatigue. Negative for activity change, chills, diaphoresis and fever.  HENT: Negative for ear pain, postnasal drip, rhinorrhea, sinus pressure, sinus pain and sore throat.   Eyes: Negative for pain.  Respiratory: Negative.  Negative for cough, choking, shortness of breath, wheezing and stridor.    Cardiovascular: Negative.  Negative for chest pain and palpitations.  Gastrointestinal: Positive for diarrhea, nausea and vomiting. Negative for abdominal pain, blood in stool and constipation.  Genitourinary: Negative.  Negative for flank pain, frequency, hematuria, pelvic pain, urgency, vaginal bleeding, vaginal discharge and vaginal pain.  Musculoskeletal: Negative.  Negative for arthralgias, back pain, myalgias, neck pain and neck stiffness.  Skin: Negative.  Negative for color change, rash and wound.  Neurological: Negative.  Negative for dizziness, seizures, light-headedness and headaches.  All other systems reviewed and are negative.    Physical Exam Triage Vital Signs ED Triage Vitals  Enc Vitals Group     BP 08/09/20 1157 112/77     Pulse Rate 08/09/20 1157 93     Resp 08/09/20 1157 18     Temp 08/09/20 1157 98 F (36.7 C)     Temp Source 08/09/20 1157 Oral     SpO2 08/09/20 1157 100 %     Weight 08/09/20 1159 108 lb 12.8 oz (49.4 kg)     Height 08/09/20 1159 5\' 2"  (1.575 m)     Head Circumference --      Peak Flow --      Pain Score 08/09/20 1158 0     Pain Loc --      Pain Edu? --      Excl. in GC? --    No data found.  Updated Vital Signs BP 112/77 (BP Location: Left Arm)   Pulse 93   Temp 98 F (36.7 C) (Oral)   Resp 18   Ht 5\' 2"  (1.575 m)   Wt 49.4 kg   LMP 07/07/2020 (Approximate)   SpO2 100%   BMI 19.90 kg/m   Visual Acuity Right Eye Distance:   Left Eye Distance:   Bilateral Distance:    Right Eye Near:   Left Eye Near:    Bilateral Near:     Physical Exam Vitals and nursing note reviewed.  Constitutional:      General: She is not in acute distress.    Appearance: Normal appearance. She is not ill-appearing, toxic-appearing or diaphoretic.  HENT:     Head: Normocephalic and atraumatic.     Nose: Nose normal. No congestion or rhinorrhea.     Mouth/Throat:     Mouth: Mucous membranes are moist.     Pharynx: No oropharyngeal exudate or  posterior oropharyngeal erythema.  Eyes:     General: No scleral icterus.       Right eye: No discharge.        Left  eye: No discharge.     Extraocular Movements: Extraocular movements intact.     Conjunctiva/sclera: Conjunctivae normal.     Pupils: Pupils are equal, round, and reactive to light.  Cardiovascular:     Rate and Rhythm: Normal rate and regular rhythm.     Pulses: Normal pulses.     Heart sounds: Normal heart sounds. No murmur heard. No friction rub. No gallop.   Pulmonary:     Effort: Pulmonary effort is normal. No respiratory distress.     Breath sounds: Normal breath sounds. No stridor. No wheezing, rhonchi or rales.  Abdominal:     General: Abdomen is flat. Bowel sounds are increased. There is no distension.     Palpations: Abdomen is soft. There is no shifting dullness, fluid wave, hepatomegaly or splenomegaly.     Tenderness: There is no abdominal tenderness. There is no right CVA tenderness, left CVA tenderness, guarding or rebound. Negative signs include Murphy's sign, McBurney's sign and psoas sign.  Musculoskeletal:     Cervical back: Normal range of motion and neck supple. No rigidity or tenderness.  Lymphadenopathy:     Cervical: No cervical adenopathy.  Skin:    General: Skin is warm.     Capillary Refill: Capillary refill takes less than 2 seconds.     Coloration: Skin is not jaundiced or pale.     Findings: No erythema, lesion or rash.  Neurological:     Mental Status: She is alert.      UC Treatments / Results  Labs (all labs ordered are listed, but only abnormal results are displayed) Labs Reviewed  URINALYSIS, COMPLETE (UACMP) WITH MICROSCOPIC - Abnormal; Notable for the following components:      Result Value   APPearance CLOUDY (*)    Specific Gravity, Urine >1.030 (*)    Bilirubin Urine SMALL (*)    Ketones, ur TRACE (*)    Protein, ur TRACE (*)    Leukocytes,Ua SMALL (*)    Bacteria, UA RARE (*)    All other components within normal  limits  SARS CORONAVIRUS 2 (TAT 6-24 HRS)  URINE CULTURE  PREGNANCY, URINE    EKG   Radiology No results found.  Procedures Procedures (including critical care time)  Medications Ordered in UC Medications - No data to display  Initial Impression / Assessment and Plan / UC Course  I have reviewed the triage vital signs and the nursing notes.  Pertinent labs & imaging results that were available during my care of the patient were reviewed by me and considered in my medical decision making (see chart for details).  Clinical impression: 3 days of nausea vomiting and diarrhea.  Physical exam is actually fairly reassuring.  Vital signs are also reassuring.  Patient has not been vaccinated against COVID and is requesting Covid testing.  Treatment plan: 1.  The findings and treatment plan were discussed in detail with the patient.  Patient was in agreement. 2.  Obtain Covid testing was pending at the time of discharge. 3.  Obtained a UA.  Results are above.  Did show that she had some cloudiness with 21-50 WBCs.  There was also small amount of leukocytes but no nitrites.  Concerning for a possible UTI.  We will treat with an antibiotic and send off the culture. 4.  We will also obtain a urine pregnancy test given the nausea and vomiting.  It was negative. 5.  Educational handouts were provided. 6.  Also provided a work note. 7.  Over-the-counter  meds as needed, Tylenol or Motrin for any fever or discomfort.  Do want her to get plenty of rest and plenty of fluids. 8.  Given her nausea and vomiting sent in a prescription for Zofran. 9.  She will follow-up as needed.  If her symptoms were to worsen she knows that she needs to go to the ER for higher level care and potential advanced imaging.    Final Clinical Impressions(s) / UC Diagnoses   Final diagnoses:  Nausea vomiting and diarrhea  Dehydration  Cystitis  Lower urinary tract infectious disease     Discharge Instructions      Your urine shows that you have a possible urinary tract infection.  I will treat you with an antibiotic.  I also sent off the culture.  Someone may call you and change the antibiotic, or they may call you and tell you to stop it. Your Covid test was pending at the time of discharge. Your urine pregnancy test was negative. Educational handouts were provided. I also provided you a work note keep you out of work today and tomorrow and you can return Tuesday, March 22. If your symptoms persist please see your primary care provider.  If they worsen in any way please go to the emergency room.  I hope you get to feeling better, Dr. Zachery DauerBarnes    ED Prescriptions    Medication Sig Dispense Auth. Provider   nitrofurantoin, macrocrystal-monohydrate, (MACROBID) 100 MG capsule Take 1 capsule (100 mg total) by mouth 2 (two) times daily. 10 capsule Delton SeeBarnes, Marcia Lepera, MD   ondansetron (ZOFRAN) 4 MG tablet Take 1 tablet (4 mg total) by mouth every 6 (six) hours. 12 tablet Delton SeeBarnes, Kazandra Forstrom, MD     PDMP not reviewed this encounter.   Delton SeeBarnes, Kasra Melvin, MD 08/14/20 [phone]

## 2021-05-20 DIAGNOSIS — F439 Reaction to severe stress, unspecified: Secondary | ICD-10-CM | POA: Insufficient documentation

## 2021-08-03 ENCOUNTER — Ambulatory Visit: Payer: Self-pay | Admitting: *Deleted

## 2021-08-03 ENCOUNTER — Ambulatory Visit: Payer: Medicaid Other | Admitting: Internal Medicine

## 2021-08-03 ENCOUNTER — Encounter: Payer: Self-pay | Admitting: *Deleted

## 2021-08-03 NOTE — Telephone Encounter (Signed)
?  Chief Complaint: left eye swollen and draining this am , requesting appt ?Symptoms: left eye swollen, red, draining yellow and itching. Top lid swollen  ?Frequency: today  ?Pertinent Negatives: Patient denies fever, no sore throat today but had Sunday and monday ?Disposition: [] ED /[x] Urgent Care (no appt availability in office) / [] Appointment(In office/virtual)/ [x]  [facility]/ [] Home Care/ [] Refused Recommended Disposition /[] [place]/ []  Follow-up with PCP ?Additional Notes:  ? ?Patient has not been seen in clinic since 2019 and needs new patient appt. Advised UC or online UC VV. Patient reports she will go to UC.  ? ? ?Reason for Disposition ? Eye is very swollen (shut or almost) ? ?Answer Assessment - Initial Assessment Questions ?1. EYE DISCHARGE: "Is the discharge in one or both eyes?" "What color is it?" "How much is there?" "When did the discharge start?"  ?    One eye left eye discharge this am  ?2. REDNESS OF SCLERA: "Is the redness in one or both eyes?" "When did the redness start?"  ?    Left eye red ?3. EYELIDS: "Are the eyelids red or swollen?" If Yes, ask: "How much?"  ?    Eyelid swollen , red. Closed this am  ?4. VISION: "Is there any difficulty seeing clearly?"  ?    denies ?5. PAIN: "Is there any pain? If Yes, ask: "How bad is it?" (Scale 1-10; or mild, moderate, severe) ?   - MILD (1-3): doesn't interfere with normal activities  ?   - MODERATE (4-7): interferes with normal activities or awakens from sleep ?   - SEVERE (8-10): excruciating pain, unable to do any normal activities   ?    Denies  ?6. CONTACT LENS: "Do you wear contacts?" ?    na ?7. OTHER SYMPTOMS: "Do you have any other symptoms?" (e.g., fever, runny nose, cough) ?    Sore throat Sunday  ?8. PREGNANCY: "Is there any chance you are pregnant?" "When was your last menstrual period?" ?    na ? ?Protocols used: Eye - Pus or Discharge-A-AH ? ?

## 2021-08-03 NOTE — Telephone Encounter (Signed)
This encounter was created in error - please disregard.

## 2021-08-03 NOTE — Telephone Encounter (Signed)
Pt called [facility] to be seen for pink eye/ she is not a pt but it shows on her chart she is a pt at [facility] with Dr. Charlotta Newton / pt has not been seen there since 2019 and would have to re establish care / so I had to cancel her appt for today /please advise  ? ?Left VM to pt to call back to discuss symptoms. ?

## 2021-09-08 DIAGNOSIS — Z8619 Personal history of other infectious and parasitic diseases: Secondary | ICD-10-CM | POA: Insufficient documentation
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
## 2022-02-26 ENCOUNTER — Emergency Department
Admission: EM | Admit: 2022-02-26 | Discharge: 2022-02-26 | Discharge status: Against Medical Advice | Payer: Medicaid Other | Attending: Emergency Medicine | Admitting: Emergency Medicine

## 2022-02-26 ENCOUNTER — Encounter: Payer: Self-pay | Admitting: Emergency Medicine

## 2022-02-26 ENCOUNTER — Ambulatory Visit (INDEPENDENT_AMBULATORY_CARE_PROVIDER_SITE_OTHER): Payer: Medicaid Other

## 2022-02-26 ENCOUNTER — Ambulatory Visit
Admission: EM | Admit: 2022-02-26 | Discharge: 2022-02-26 | Disposition: A | Discharge status: Home / Self Care | Payer: Medicaid Other | Attending: Family Medicine | Admitting: Family Medicine

## 2022-02-26 DIAGNOSIS — R109 Unspecified abdominal pain: Secondary | ICD-10-CM | POA: Insufficient documentation

## 2022-02-26 DIAGNOSIS — R1110 Vomiting, unspecified: Secondary | ICD-10-CM

## 2022-02-26 DIAGNOSIS — D72825 Bandemia: Secondary | ICD-10-CM | POA: Diagnosis not present

## 2022-02-26 DIAGNOSIS — Z5321 Procedure and treatment not carried out due to patient leaving prior to being seen by health care provider: Secondary | ICD-10-CM | POA: Insufficient documentation

## 2022-02-26 DIAGNOSIS — R1030 Lower abdominal pain, unspecified: Secondary | ICD-10-CM | POA: Insufficient documentation

## 2022-02-26 LAB — CBC WITH DIFFERENTIAL/PLATELET
Abs Immature Granulocytes: 0.05 10*3/uL (ref 0.00–0.07)
Basophils Absolute: 0.1 10*3/uL (ref 0.0–0.1)
Basophils Relative: 0 %
Eosinophils Absolute: 0.3 10*3/uL (ref 0.0–0.5)
Eosinophils Relative: 3 %
HCT: 40 % (ref 36.0–46.0)
Hemoglobin: 13.3 g/dL (ref 12.0–15.0)
Immature Granulocytes: 0 %
Lymphocytes Relative: 14 %
Lymphs Abs: 1.7 10*3/uL (ref 0.7–4.0)
MCH: 30.9 pg (ref 26.0–34.0)
MCHC: 33.3 g/dL (ref 30.0–36.0)
MCV: 93 fL (ref 80.0–100.0)
Monocytes Absolute: 0.9 10*3/uL (ref 0.1–1.0)
Monocytes Relative: 8 %
Neutro Abs: 9.3 10*3/uL — ABNORMAL HIGH (ref 1.7–7.7)
Neutrophils Relative %: 75 %
Platelets: 294 10*3/uL (ref 150–400)
RBC: 4.3 MIL/uL (ref 3.87–5.11)
RDW: 13.1 % (ref 11.5–15.5)
WBC: 12.4 10*3/uL — ABNORMAL HIGH (ref 4.0–10.5)
nRBC: 0 % (ref 0.0–0.2)

## 2022-02-26 LAB — WET PREP, GENITAL
Clue Cells Wet Prep HPF POC: NONE SEEN
Sperm: NONE SEEN
Trich, Wet Prep: NONE SEEN
WBC, Wet Prep HPF POC: <10 — AB (ref <10)
Yeast Wet Prep HPF POC: NONE SEEN

## 2022-02-26 LAB — URINALYSIS, ROUTINE W REFLEX MICROSCOPIC
Bilirubin Urine: NEGATIVE
Glucose, UA: NEGATIVE mg/dL
Hgb urine dipstick: NEGATIVE
Ketones, ur: NEGATIVE mg/dL
Leukocytes,Ua: NEGATIVE
Nitrite: NEGATIVE
Protein, ur: NEGATIVE mg/dL
Specific Gravity, Urine: 1.02 (ref 1.005–1.030)
pH: 7.5 (ref 5.0–8.0)

## 2022-02-26 LAB — BASIC METABOLIC PANEL
Anion gap: 3 — ABNORMAL LOW (ref 5–15)
BUN: 10 mg/dL (ref 6–20)
CO2: 25 mmol/L (ref 22–32)
Calcium: 8.6 mg/dL — ABNORMAL LOW (ref 8.9–10.3)
Chloride: 107 mmol/L (ref 98–111)
Creatinine, Ser: 0.68 mg/dL (ref 0.44–1.00)
GFR, Estimated: >60 mL/min (ref >60)
Glucose, Bld: 89 mg/dL (ref 70–99)
Potassium: 3.9 mmol/L (ref 3.5–5.1)
Sodium: 135 mmol/L (ref 135–145)

## 2022-02-26 LAB — PREGNANCY, URINE: Preg Test, Ur: NEGATIVE

## 2022-02-26 NOTE — ED Triage Notes (Signed)
Pt to ED via POV from UC. Pt was seen for abdominal pain x2 days. Pt reports bilateral pain but worse on left side. Pt also reports vomiting. Pt states they also did a scan that showed a possible kidney stone. UC lab work showed elevated WBC

## 2022-02-26 NOTE — ED Triage Notes (Signed)
Patient c/o mid abdominal pain that started 2 days ago.  Patient reports vomiting once today. Patient denies diarrhea.  Patient denies any urinary symptoms. Patient reports some vaginal discharge.

## 2022-02-26 NOTE — Discharge Instructions (Signed)

## 2022-02-26 NOTE — ED Provider Notes (Signed)
[facility]    CSN: [id number] Arrival date & time: 02/26/22  1440      History   Chief Complaint Chief Complaint  Patient presents with   Abdominal Pain    HPI Sherry Nunez is a 26 y.o. female.   HPI  Shynia presents for lower left abdominal pain that radiates to the right.  Pain rated 7/10 and at times reaches 10/10. Seating on her knees, putting pressure and belly massages usually helps but now its not.  Reports every single time after her period she gets really abdominal pain typically 8-12 days after her period. Normally it lasts 24 hours.  Pain started on Wednesday. Everyone at work was saying their stomach hurts. She has history of ovarian cyst on thel left that was removed about 14 years ago. She feels fatigued.   She has been watery vomited twice this morning. She called out of work. She was told she may have endometriosis.   Denies fever, vaginal discharge, dysuria.     Past Surgeries: cyst removed   Symptoms Nausea/Vomiting: yes  Diarrhea: no  Constipation: no  Melena/BRBPR: no  Hematemesis: no  Anorexia: yes  Fever/Chills: yes, chills   Dysuria: no  Rash: no  Wt loss: no that's abnormal   EtOH use: no  NSAIDs/ASA: Alleve and Ibuprofen after getting her tooth pulled   LMP: Patient's last menstrual period was 02/10/2022 (approximate). 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   Lumbago 10/14/2016   Hypothyroid 08/25/2016    History of trichomoniasis 08/25/2016   BMI 34.0-34.9,adult 08/25/2016   Asthma 07/08/2015   Allergic rhinitis 07/08/2015   Simple constipation 02/13/2012   Generalized abdominal pain    Right ovarian cyst     Past Surgical History:  Procedure Laterality Date   OVARIAN CYST REMOVAL Right 06/08/2009   benign cystadenoma    OB History     Gravida  1   Para  1   Term  1   Preterm      AB      Living  1      SAB      IAB      Ectopic      Multiple  0   Live Births  1            Home Medications    Prior to Admission medications   Medication Sig Start Date End Date Taking? Authorizing Provider  ibuprofen (ADVIL) 600 MG tablet Take 1 tablet (600 mg total) by mouth every 6 (six) hours as needed. 12/31/18   Farrel Conners, CNM  nitrofurantoin, macrocrystal-monohydrate, (MACROBID) 100 MG capsule Take 1 capsule (100 mg total) by mouth 2 (two) times daily. 08/09/20   Delton See, MD  Norethindrone Acetate-Ethinyl Estrad-FE (MICROGESTIN 24 FE) 1-20 MG-MCG(24) tablet Take 1 tablet by mouth daily. Patient not taking: Reported on 12/31/2018 10/22/18  Copland, Alicia B, PA-C  ondansetron (ZOFRAN) 4 MG tablet Take 1 tablet (4 mg total) by mouth every 6 (six) hours. 08/09/20   Verda Cumins, MD    Family History Family History  Problem Relation Age of Onset   Breast cancer Mother    Bipolar disorder Mother    Healthy Father    GER disease Maternal Uncle     Social History Social History   Tobacco Use   Smoking status: Every Day    Packs/day: 0.30    Types: Cigarettes   Smokeless tobacco: Never  Vaping Use   Vaping Use: Never used  Substance Use Topics   Alcohol use: Not Currently    Comment: occ   Drug use: No    Comment: Former/quit 09/2016     Allergies   Penicillins   Review of Systems Review of Systems :negative unless otherwise stated in HPI.      Physical Exam Triage Vital Signs ED Triage Vitals  Enc Vitals Group     BP 02/26/22  1505 110/71     Pulse Rate 02/26/22 1505 87     Resp 02/26/22 1505 14     Temp 02/26/22 1505 97.9 F (36.6 C)     Temp Source 02/26/22 1505 Oral     SpO2 02/26/22 1505 98 %     Weight 02/26/22 1502 128 lb (58.1 kg)     Height 02/26/22 1502 5\' 2"  (1.575 m)     Head Circumference --      Peak Flow --      Pain Score 02/26/22 1501 7     Pain Loc --      Pain Edu? --      Excl. in [place]? --    No data found.  Updated Vital Signs BP 110/71 (BP Location: Right Arm)   Pulse 87   Temp 97.9 F (36.6 C) (Oral)   Resp 14   Ht 5\' 2"  (1.575 m)   Wt 58.1 kg   LMP 02/10/2022 (Approximate)   SpO2 98%   BMI 23.41 kg/m   Visual Acuity Right Eye Distance:   Left Eye Distance:   Bilateral Distance:    Right Eye Near:   Left Eye Near:    Bilateral Near:     Physical Exam  GEN: uncomfortable appearing female, in no acute distress   CV: regular rate and rhythm RESP: no increased work of breathing, clear to ascultation bilaterally ABD: Bowel sounds present. Soft, RLQ, LLQ and suprapubic tenderness, non-distended.  No guarding, no rebound, no appreciable hepatosplenomegaly, no CVA tenderness, positive  McBurney's, negative Murphy MSK: no extremity edema SKIN: warm, dry, no rash on visible skin NEURO: alert, moves all extremities appropriately PSYCH: Normal affect, appropriate speech and behavior   UC Treatments / Results  Labs (all labs ordered are listed, but only abnormal results are displayed) Labs Reviewed  WET PREP, GENITAL - Abnormal; Notable for the following components:      Result Value   WBC, Wet Prep HPF POC <10 (*)    All other components within normal limits  CBC WITH DIFFERENTIAL/PLATELET - Abnormal; Notable for the following components:   WBC 12.4 (*)    Neutro Abs 9.3 (*)    All other components within normal limits  BASIC METABOLIC PANEL - Abnormal; Notable for the following components:   Calcium 8.6 (*)    Anion gap 3 (*)    All other components within normal  limits  URINALYSIS, ROUTINE W REFLEX MICROSCOPIC  PREGNANCY, URINE  CERVICOVAGINAL ANCILLARY ONLY    EKG   Radiology DG Abdomen 1 View  Result Date: 02/26/2022 CLINICAL DATA:  Abdominal pain.  Vomiting.a EXAM: ABDOMEN - 1 VIEW COMPARISON:  CT, 06/05/2009. FINDINGS: Normal bowel gas pattern. Small stone projects over the midpole the right kidney. No other evidence of a renal stone. No evidence of a ureteral stone. Soft tissues otherwise unremarkable. Normal skeletal structures. IMPRESSION: 1. No acute findings.  Normal bowel gas pattern. 2. Probable right intrarenal stone. Electronically Signed   By: Amie Portland M.D.   On: 02/26/2022 16:13    Procedures Procedures (including critical care time)  Medications Ordered in UC Medications - No data to display  Initial Impression / Assessment and Plan / UC Course  I have reviewed the triage vital signs and the nursing notes.  Pertinent labs & imaging results that were available during my care of the patient were reviewed by me and considered in my medical decision making (see chart for details).        Patient is a  26 y.o. female with history ?endometriosis who presents after having insidious moderately severe abdominal pain about 2 days ago.  Overall, patient is well-appearing, well-hydrated, and in no acute distress.  Vital signs stable.  Sherry Nunez afebrile.  She has left lower quadrant and right lower quadrant tenderness on exam.  Obtained UA, urine pregnancy, CBC, CMP, and lipase.  No personal history of kidney stones.  Urine pregnancy was negative.  Wet prep not consistent with yeast, trichomoniasis or bacterial vaginitis.  Urinalysis without acute cystitis or hematuria.  Discussed obtaining STI testing and patient is agreeable.   KUB was obtained that showed possible right renal stone. GC/chlamydia swab collected.  No significant metabolic derangements on BMP.  She does have leukocytosis, WBC 12.4.   Given her tenderness on exam and  leukocytosis she would warrant advanced imaging that is unfortunately not available here today.  Unable to rule out appendicitis or intraabdominal abscess. Discussed ED evaluation and patient is agreeable.  She elected to travel by private vehicle.  Spoke with charge nurse/triage RN at [facility] will await patient's arrival.    Discussed MDM, treatment plan and plan for follow-up with patient/parent who agrees with plan.    Final Clinical Impressions(s) / UC Diagnoses   Final diagnoses:  Lower abdominal pain  Bandemia     Discharge Instructions      You have been advised to follow up immediately in the emergency department for concerning signs or symptoms as discussed during your visit. If you declined EMS transport, please have a family member take you directly to the ED at this time. Do not delay.   Based on concerns about condition, if you do not follow up in the ED, you may risk poor outcomes including worsening of condition, delayed treatment and potentially life threatening issues. If you have declined to go to the ED at this time, you should call your PCP immediately to set up a follow up appointment.   Go to ED for red flag symptoms, including; fevers you cannot reduce with Tylenol/Motrin, severe headaches, vision changes, numbness/weakness in part of the body, lethargy, confusion, intractable vomiting, severe dehydration, chest pain, breathing difficulty, severe persistent abdominal or pelvic pain, signs of severe infection (increased redness, swelling of an area), feeling faint or passing out, dizziness, etc. You should especially go to the ED for sudden acute worsening of condition if you do not elect to go at this time.  ED Prescriptions   None    PDMP not reviewed this encounter.   Katha Cabal, DO 02/27/22 0935

## 2022-02-26 NOTE — ED Notes (Signed)
Patient is being discharged from the Urgent Care and sent to the [facility] Emergency Department via private vehicle . Per Dr. Susa Simmonds, patient is in need of higher level of care due to elevated WBC and abdominal pain. Patient is aware and verbalizes understanding of plan of care.  Vitals:   02/26/22 1505  BP: 110/71  Pulse: 87  Resp: 14  Temp: 97.9 F (36.6 C)  SpO2: 98%

## 2022-02-28 LAB — CERVICOVAGINAL ANCILLARY ONLY
Bacterial Vaginitis (gardnerella): POSITIVE — AB
Candida Glabrata: NEGATIVE
Candida Vaginitis: POSITIVE — AB
Chlamydia: NEGATIVE
Comment: NEGATIVE
Comment: NEGATIVE
Comment: NEGATIVE
Comment: NEGATIVE
Comment: NEGATIVE
Comment: NORMAL
Neisseria Gonorrhea: NEGATIVE
Trichomonas: NEGATIVE

## 2022-03-01 ENCOUNTER — Telehealth (HOSPITAL_COMMUNITY): Payer: Self-pay | Admitting: Emergency Medicine

## 2022-03-01 MED ORDER — METRONIDAZOLE 500 MG PO TABS: 500.0000 mg | ORAL_TABLET | Freq: Two times a day (BID) | ORAL | 0 refills | Status: DC

## 2022-03-01 MED ORDER — FLUCONAZOLE 150 MG PO TABS: 150.0000 mg | ORAL_TABLET | Freq: Once | ORAL | 0 refills | Status: AC

## 2022-10-03 ENCOUNTER — Ambulatory Visit
Admission: EM | Admit: 2022-10-03 | Discharge: 2022-10-03 | Disposition: A | Discharge status: Home / Self Care | Payer: Medicaid Other | Attending: Emergency Medicine | Admitting: Emergency Medicine

## 2022-10-03 DIAGNOSIS — J4521 Mild intermittent asthma with (acute) exacerbation: Secondary | ICD-10-CM | POA: Diagnosis not present

## 2022-10-03 MED ORDER — ALBUTEROL SULFATE HFA 108 (90 BASE) MCG/ACT IN AERS: 2.0000 | INHALATION_SPRAY | RESPIRATORY_TRACT | 0 refills | Status: AC | PRN

## 2022-10-03 MED ORDER — PREDNISONE 20 MG PO TABS: 60.0000 mg | ORAL_TABLET | Freq: Every day | ORAL | 0 refills | Status: AC

## 2022-10-03 MED ORDER — AEROCHAMBER MV MISC: 2 refills | Status: DC

## 2022-10-03 NOTE — ED Triage Notes (Signed)
Pt c/o cough,wheezing sob at rest & w/exertion & congestion x1 wk. Hx of asthma. Has tried allergies meds & robitussin.

## 2022-10-03 NOTE — Discharge Instructions (Signed)
Use the Albuterol inhaler with the spacer, 2 puffs every 4-6 hours as needed for shortness of breath or wheezing.  Take the Prednisone daily with food at breakfast time daily for 5 days.  If you develop a fever, increased shortness of breath, or start coughing up bitter sputum return for re-evaluation.  Stop smoking.

## 2022-10-03 NOTE — ED Provider Notes (Signed)
[facility]    CSN: [id number] Arrival date & time: 10/03/22  0836      History   Chief Complaint Chief Complaint  Patient presents with   Cough   Congestion    HPI Sherry Nunez is a 27 y.o. female.   HPI  27 year old female with a past medical history significant for asthma and hypothyroidism presents for evaluation of 1 week worth of cough, wheezing, and shortness of breath.  She also endorses some mild nasal congestion.  She has been using her daughters and mother's albuterol with relief of symptoms.  She has not had an inhaler prescribed to her since her pregnancy in 2018.  The patient denies fever.  She is also been using Robitussin which helps her symptoms.  Her cough is intermittently productive for a thick white sputum.  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   Lumbago 10/14/2016   Hypothyroid 08/25/2016   History of trichomoniasis 08/25/2016   BMI 34.0-34.9,adult 08/25/2016   Asthma 07/08/2015   Allergic rhinitis 07/08/2015   Simple constipation 02/13/2012   Generalized abdominal pain    Right ovarian cyst     Past Surgical History:  Procedure Laterality Date   OVARIAN CYST REMOVAL Right 06/08/2009   benign cystadenoma    OB History     Gravida  1   Para  1   Term  1   Preterm      AB      Living  1      SAB      IAB      Ectopic      Multiple  0   Live Births  1            Home Medications    Prior to Admission medications   Medication Sig Start Date End Date Taking? Authorizing Provider  albuterol (VENTOLIN HFA) 108 (90 Base) MCG/ACT  inhaler Inhale 2 puffs into the lungs every 4 (four) hours as needed. 10/03/22  Yes Becky Augusta, NP  predniSONE (DELTASONE) 20 MG tablet Take 3 tablets (60 mg total) by mouth daily with breakfast for 5 days. 3 tablets daily for 5 days. 10/03/22 10/08/22 Yes Becky Augusta, NP  Spacer/Aero-Holding Deretha Emory (AEROCHAMBER MV) inhaler Use as instructed 10/03/22  Yes Becky Augusta, NP    Family History Family History  Problem Relation Age of Onset   Breast cancer Mother    Bipolar disorder Mother    Healthy Father    GER disease Maternal Uncle     Social History Social History   Tobacco Use   Smoking status: Every Day    Packs/day: .3    Types: Cigarettes   Smokeless tobacco: Never  Vaping Use   Vaping Use: Never used  Substance Use Topics   Alcohol use: Not Currently    Comment: occ   Drug use: No    Comment: Former/quit 09/2016     Allergies   Penicillins   Review of Systems Review of Systems  Constitutional:  Negative for fever.  HENT:  Positive  for congestion. Negative for rhinorrhea.   Respiratory:  Positive for cough, shortness of breath and wheezing.      Physical Exam Triage Vital Signs ED Triage Vitals  Enc Vitals Group     BP 10/03/22 0844 110/80     Pulse Rate 10/03/22 0844 80     Resp 10/03/22 0844 16     Temp 10/03/22 0844 98.1 F (36.7 C)     Temp Source 10/03/22 0844 Oral     SpO2 10/03/22 0844 95 %     Weight 10/03/22 0844 123 lb (55.8 kg)     Height 10/03/22 0844 5\' 1"  (1.549 m)     Head Circumference --      Peak Flow --      Pain Score 10/03/22 0847 0     Pain Loc --      Pain Edu? --      Excl. in [place]? --    No data found.  Updated Vital Signs BP 110/80 (BP Location: Left Arm)   Pulse 80   Temp 98.1 F (36.7 C) (Oral)   Resp 16   Ht 5\' 1"  (1.549 m)   Wt 123 lb (55.8 kg)   LMP 09/30/2022 (Exact Date)   SpO2 95%   BMI 23.24 kg/m   Visual Acuity Right Eye Distance:   Left Eye Distance:   Bilateral Distance:    Right Eye Near:    Left Eye Near:    Bilateral Near:     Physical Exam Vitals and nursing note reviewed.  Constitutional:      Appearance: Normal appearance. She is not ill-appearing.  HENT:     Head: Normocephalic and atraumatic.     Nose: Nose normal. No congestion or rhinorrhea.  Cardiovascular:     Rate and Rhythm: Normal rate and regular rhythm.     Pulses: Normal pulses.     Heart sounds: Normal heart sounds. No murmur heard.    No friction rub. No gallop.  Pulmonary:     Effort: Pulmonary effort is normal.     Breath sounds: Wheezing present. No rhonchi or rales.     Comments: She has diffuse expiratory wheezing in all lung fields.  No crackles or rhonchi appreciated. Musculoskeletal:     Cervical back: Normal range of motion and neck supple.  Lymphadenopathy:     Cervical: No cervical adenopathy.  Skin:    General: Skin is warm and dry.     Capillary Refill: Capillary refill takes less than 2 seconds.  Neurological:     General: No focal deficit present.     Mental Status: She is alert and oriented to person, place, and time.      UC Treatments / Results  Labs (all labs ordered are listed, but only abnormal results are displayed) Labs Reviewed - No data to display  EKG   Radiology No results found.  Procedures Procedures (including critical care time)  Medications Ordered in UC Medications - No data to display  Initial Impression / Assessment and Plan / UC Course  I have reviewed the triage vital signs and the nursing notes.  Pertinent labs & imaging results that were available during my care of the patient were reviewed by me and considered in my medical decision making (see chart for details).   The patient is a pleasant, nontoxic-appearing 27 year old female presenting for evaluation of cough, shortness breath, and wheezing that is been going on for the past week.  She endorses shortness of breath at  both rest and with ambulation but she is able to speak in full  sentences without any dyspnea or tachypnea.  At triage her respiratory was 16 and her room air oxygen saturation was 95%.  She has been afebrile.  She does have diffuse wheezing in all lung fields but no rales or rhonchi.  She states that she has asthma flares very intermittently and she is attributing it to the temperature changes and pollen.  I will treat the patient for a mild intermittent asthma exacerbation with 60 mg of prednisone daily for 5 days that she is to start today.  I have also prescribed her her own albuterol inhaler and spacer and have directed her to use 2 puffs every 4-6 hours as needed for shortness of breath or wheezing.  She reports that she has been using relatives inhalers at home twice daily with relief of symptoms.  Return precautions reviewed.  Patient denies need for work note.   Final Clinical Impressions(s) / UC Diagnoses   Final diagnoses:  Mild intermittent asthma with exacerbation     Discharge Instructions      Use the Albuterol inhaler with the spacer, 2 puffs every 4-6 hours as needed for shortness of breath or wheezing.  Take the Prednisone daily with food at breakfast time daily for 5 days.  If you develop a fever, increased shortness of breath, or start coughing up bitter sputum return for re-evaluation.  Stop smoking.     ED Prescriptions     Medication Sig Dispense Auth. Provider   albuterol (VENTOLIN HFA) 108 (90 Base) MCG/ACT inhaler Inhale 2 puffs into the lungs every 4 (four) hours as needed. 18 g Becky Augusta, NP   Spacer/Aero-Holding Chambers (AEROCHAMBER MV) inhaler Use as instructed 1 each Becky Augusta, NP   predniSONE (DELTASONE) 20 MG tablet Take 3 tablets (60 mg total) by mouth daily with breakfast for 5 days. 3 tablets daily for 5 days. 15 tablet Becky Augusta, NP      PDMP not reviewed this encounter.   Becky Augusta, NP 10/03/22 [phone]

## 2022-10-04 ENCOUNTER — Telehealth: Payer: Self-pay | Admitting: Urgent Care

## 2022-10-04 DIAGNOSIS — B370 Candidal stomatitis: Secondary | ICD-10-CM

## 2022-10-04 MED ORDER — NYSTATIN 100000 UNIT/ML MT SUSP: 5.0000 mL | Freq: Four times a day (QID) | OROMUCOSAL | 0 refills | Status: AC

## 2022-10-04 NOTE — Telephone Encounter (Signed)
Patient contacted clinic for follow-up after office visit on 10/03/2022 (yesterday) seen for mild intermittent asthma with exacerbation and treated with oral prednisone 60 mg x 5 days.  She reports acute onset of oral thrush which she attributes to the medication.  She is requesting treatment for oral yeast infection.

## 2023-06-01 DIAGNOSIS — Z720 Tobacco use: Secondary | ICD-10-CM | POA: Insufficient documentation

## 2023-06-01 DIAGNOSIS — F1011 Alcohol abuse, in remission: Secondary | ICD-10-CM | POA: Insufficient documentation

## 2023-06-01 DIAGNOSIS — Z880 Allergy status to penicillin: Secondary | ICD-10-CM | POA: Insufficient documentation

## 2023-06-01 DIAGNOSIS — R102 Pelvic and perineal pain: Secondary | ICD-10-CM | POA: Insufficient documentation

## 2023-06-02 DIAGNOSIS — E538 Deficiency of other specified B group vitamins: Secondary | ICD-10-CM | POA: Insufficient documentation

## 2023-06-02 DIAGNOSIS — E559 Vitamin D deficiency, unspecified: Secondary | ICD-10-CM | POA: Insufficient documentation

## 2023-07-02 ENCOUNTER — Ambulatory Visit
Admission: EM | Admit: 2023-07-02 | Discharge: 2023-07-02 | Disposition: A | Discharge status: Home / Self Care | Payer: Medicaid Other | Attending: Family Medicine | Admitting: Family Medicine

## 2023-07-02 ENCOUNTER — Encounter: Payer: Self-pay | Admitting: Emergency Medicine

## 2023-07-02 ENCOUNTER — Other Ambulatory Visit: Payer: Self-pay

## 2023-07-02 ENCOUNTER — Emergency Department: Payer: Medicaid Other

## 2023-07-02 ENCOUNTER — Emergency Department
Admission: EM | Admit: 2023-07-02 | Discharge: 2023-07-02 | Disposition: A | Discharge status: Home / Self Care | Payer: Medicaid Other | Attending: Emergency Medicine | Admitting: Emergency Medicine

## 2023-07-02 DIAGNOSIS — S0993XA Unspecified injury of face, initial encounter: Secondary | ICD-10-CM

## 2023-07-02 DIAGNOSIS — R519 Headache, unspecified: Secondary | ICD-10-CM | POA: Diagnosis not present

## 2023-07-02 DIAGNOSIS — J45909 Unspecified asthma, uncomplicated: Secondary | ICD-10-CM | POA: Diagnosis not present

## 2023-07-02 DIAGNOSIS — S0990XA Unspecified injury of head, initial encounter: Secondary | ICD-10-CM

## 2023-07-02 DIAGNOSIS — S8010XA Contusion of unspecified lower leg, initial encounter: Secondary | ICD-10-CM | POA: Diagnosis not present

## 2023-07-02 DIAGNOSIS — E039 Hypothyroidism, unspecified: Secondary | ICD-10-CM | POA: Insufficient documentation

## 2023-07-02 DIAGNOSIS — S0083XA Contusion of other part of head, initial encounter: Secondary | ICD-10-CM | POA: Insufficient documentation

## 2023-07-02 DIAGNOSIS — S5000XA Contusion of unspecified elbow, initial encounter: Secondary | ICD-10-CM | POA: Diagnosis not present

## 2023-07-02 DIAGNOSIS — S60229A Contusion of unspecified hand, initial encounter: Secondary | ICD-10-CM | POA: Insufficient documentation
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
## 2023-07-02 NOTE — Discharge Instructions (Signed)
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
## 2023-07-02 NOTE — Discharge Instructions (Signed)
 Your CT scan did not show any acute broken bones of your face or neck, no bleeding in your brain.  Please practice brain rest as we discussed for possible concussion.  Please follow-up with your outpatient provider.  Please return for new, worsening, or changing symptoms or other concerns.  It was a pleasure caring for you today.
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
## 2023-08-04 ENCOUNTER — Ambulatory Visit
Admission: EM | Admit: 2023-08-04 | Discharge: 2023-08-04 | Disposition: A | Discharge status: Home / Self Care | Attending: Internal Medicine | Admitting: Internal Medicine

## 2023-08-04 ENCOUNTER — Encounter: Payer: Self-pay | Admitting: Emergency Medicine

## 2023-08-04 DIAGNOSIS — N898 Other specified noninflammatory disorders of vagina: Secondary | ICD-10-CM | POA: Diagnosis present

## 2023-08-04 NOTE — ED Triage Notes (Signed)
 Patient states that she woke up this morning with some discharge and vaginal itching.

## 2023-08-04 NOTE — Discharge Instructions (Signed)
 We will call when the results are back
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
## 2023-08-07 ENCOUNTER — Telehealth: Payer: Self-pay | Admitting: Emergency Medicine

## 2023-08-07 LAB — CERVICOVAGINAL ANCILLARY ONLY
Bacterial Vaginitis (gardnerella): POSITIVE — AB
Candida Glabrata: NEGATIVE
Candida Vaginitis: POSITIVE — AB
Comment: NEGATIVE
Comment: NEGATIVE
Comment: NEGATIVE

## 2023-08-07 MED ORDER — METRONIDAZOLE 500 MG PO TABS: 500.0000 mg | ORAL_TABLET | Freq: Two times a day (BID) | ORAL | 0 refills | Status: DC

## 2023-08-07 MED ORDER — FLUCONAZOLE 150 MG PO TABS: 150.0000 mg | ORAL_TABLET | ORAL | 0 refills | Status: DC

## 2023-08-07 NOTE — Telephone Encounter (Signed)
 Patient tested positive for both vaginal yeast infection and bacterial vaginosis.  Metronidazole Diflucan sent to the pharmacy.

## 2023-08-08 ENCOUNTER — Telehealth (HOSPITAL_COMMUNITY): Payer: Self-pay

## 2023-08-08 DIAGNOSIS — F4310 Post-traumatic stress disorder, unspecified: Secondary | ICD-10-CM | POA: Insufficient documentation

## 2023-08-08 NOTE — Telephone Encounter (Signed)
 Created in error

## 2023-08-10 MED ORDER — FLUCONAZOLE 150 MG PO TABS: 150.0000 mg | ORAL_TABLET | ORAL | 0 refills | Status: AC

## 2023-08-10 MED ORDER — METRONIDAZOLE 500 MG PO TABS: 500.0000 mg | ORAL_TABLET | Freq: Two times a day (BID) | ORAL | 0 refills | Status: DC

## 2023-08-10 NOTE — Telephone Encounter (Signed)
 Pharmacy changed per pt request

## 2023-08-10 NOTE — Addendum Note (Signed)
 Addended by: Warren Danes on: 08/10/2023 01:01 PM   Modules accepted: Orders

## 2023-12-06 ENCOUNTER — Ambulatory Visit
Admission: EM | Admit: 2023-12-06 | Discharge: 2023-12-06 | Disposition: A | Discharge status: Home / Self Care | Attending: Family Medicine | Admitting: Family Medicine

## 2023-12-06 DIAGNOSIS — K529 Noninfective gastroenteritis and colitis, unspecified: Secondary | ICD-10-CM

## 2023-12-06 NOTE — ED Provider Notes (Signed)
 [facility]    CSN: [id number] Arrival date & time: 12/06/23  1320      History   Chief Complaint Chief Complaint  Patient presents with   Abdominal Cramping   Nausea   Diarrhea    HPI Sherry Nunez is a 28 y.o. female.   HPI  Sherry Nunez presents for abdominal pain described as cramping that started 6 days ago. She made a pizza while at work but no one else ate the pizza.  No fever. Her daughter and mom also had vomitng and diarrhea. She has well water.  Sunday, she felt fine. Monday she vomited some bile.  Since then has felt well.  She has to be seen because she works in [employer].   Patient's last menstrual period was 12/02/2023 (approximate).   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    Patient Active Problem List   Diagnosis Date Noted   PTSD (post-traumatic stress disorder) 08/08/2023   B12 deficiency 06/02/2023   Vitamin D insufficiency 06/02/2023   History of alcohol abuse 06/01/2023   Pelvic pain in female 06/01/2023   Penicillin allergy 06/01/2023   Tobacco use 06/01/2023   History of chlamydia 09/08/2021   Stress at home 05/20/2021   Family history of clotting disorder 10/22/2018   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   Lumbago 10/14/2016   Hypothyroid 08/25/2016   History of trichomoniasis 08/25/2016   BMI 34.0-34.9,adult 08/25/2016   Asthma 07/08/2015   Allergic rhinitis 07/08/2015   Mild intermittent asthma without complication 07/08/2015   Simple constipation 02/13/2012   Generalized abdominal pain    Right ovarian cyst     Past Surgical History:  Procedure Laterality Date   OVARIAN CYST REMOVAL Right 06/08/2009   benign  cystadenoma    OB History     Gravida  1   Para  1   Term  1   Preterm      AB      Living  1      SAB      IAB      Ectopic      Multiple  0   Live Births  1            Home Medications    Prior to Admission medications   Medication Sig Start Date End Date Taking? Authorizing Provider  cyanocobalamin (VITAMIN B12) 1000 MCG/ML injection Inject 1 mL (1,000 mcg total) into the muscle once a week 09/06/23  Yes [provider]  doxazosin (CARDURA) 1 MG tablet SMARTSIG:By Mouth 09/15/23  Yes [provider]  sertraline (ZOLOFT) 25 MG tablet SMARTSIG:By Mouth 09/26/23  Yes [provider]  albuterol  (VENTOLIN  HFA) 108 (90 Base) MCG/ACT inhaler Inhale 2 puffs into the lungs every 4 (four) hours as needed. 10/03/22   Bernardino Ditch, NP  metroNIDAZOLE  (FLAGYL ) 500 MG tablet Take 1 tablet (500 mg total) by mouth 2 (two) times daily. 08/10/23   Vonna Sharlet POUR, MD  Spacer/Aero-Holding Chambers (AEROCHAMBER MV) inhaler Use as instructed 10/03/22   Bernardino Ditch, NP    Family History Family History  Problem Relation Age of Onset  Breast cancer Mother    Bipolar disorder Mother    Healthy Father    GER disease Maternal Uncle     Social History Social History   Tobacco Use   Smoking status: Every Day    Current packs/day: 0.30    Types: Cigarettes   Smokeless tobacco: Never  Vaping Use   Vaping status: Never Used  Substance Use Topics   Alcohol use: Not Currently    Comment: occ   Drug use: No    Comment: Former/quit 09/2016     Allergies   Penicillins   Review of Systems Review of Systems :negative unless otherwise stated in HPI.      Physical Exam Triage Vital Signs ED Triage Vitals  Encounter Vitals Group     BP 12/06/23 1338 108/71     Girls Systolic BP Percentile --      Girls Diastolic BP Percentile --      Boys Systolic BP Percentile --      Boys Diastolic BP Percentile --      Pulse Rate 12/06/23 1338 (!) 54      Resp 12/06/23 1338 16     Temp 12/06/23 1338 98.7 F (37.1 C)     Temp Source 12/06/23 1338 Oral     SpO2 12/06/23 1338 98 %     Weight 12/06/23 1336 130 lb (59 kg)     Height 12/06/23 1336 5' 2 (1.575 m)     Head Circumference --      Peak Flow --      Pain Score 12/06/23 1340 0     Pain Loc --      Pain Education --      Exclude from Growth Chart --    No data found.  Updated Vital Signs BP 108/71 (BP Location: Right Arm)   Pulse (!) 56   Temp 98.7 F (37.1 C) (Oral)   Resp 16   Ht 5' 2 (1.575 m)   Wt 59 kg   LMP 12/02/2023 (Approximate)   SpO2 98%   BMI 23.78 kg/m   Visual Acuity Right Eye Distance:   Left Eye Distance:   Bilateral Distance:    Right Eye Near:   Left Eye Near:    Bilateral Near:     Physical Exam  GEN: pleasant well appearing female, in no acute distress  CV: bradycardic, regular rhythm  RESP: no increased work of breathing, clear to ascultation bilaterally ABD: Bowel sounds present. Soft, non-tender, non-distended.  No guarding, no rebound, no appreciable hepatosplenomegaly, no CVA tenderness, negative McBurney's, negative Murphy MSK: no extremity edema SKIN: warm, dry, no rash on visible skin   UC Treatments / Results  Labs (all labs ordered are listed, but only abnormal results are displayed) Labs Reviewed - No data to display  EKG  If EKG performed, see my interpretation and MDM section  Radiology No results found.   Procedures Procedures (including critical care time)  Medications Ordered in UC Medications - No data to display  Initial Impression / Assessment and Plan / UC Course  I have reviewed the triage vital signs and the nursing notes.  Pertinent labs & imaging results that were available during my care of the patient were reviewed by me and considered in my medical decision making (see chart for details).       Patient is a  28 y.o. female who presents after having vomiting, diarrhea and cramping abdominal  pain about a week ago.  Overall, patient is  well-appearing, well-hydrated, and in no acute distress.  Vital signs stable.  [name] afebrile.  Exam is not concerning for an acute abdomen.She is feeling much better. Discussed the importance of hydration. Work note provided.     Discussed MDM, treatment plan and plan for follow-up with patient who agrees with plan.    Final Clinical Impressions(s) / UC Diagnoses   Final diagnoses:  Gastroenteritis     Discharge Instructions      Stay hydrated.      ED Prescriptions   None    PDMP not reviewed this encounter.   Abrea Henle, DO 12/06/23 1453

## 2023-12-06 NOTE — Discharge Instructions (Signed)
 Stay hydrated

## 2023-12-06 NOTE — ED Triage Notes (Addendum)
 Pt c/o abd cramping,nausea & diarrhea off/on since thurs. Had pizza from work & sx's started after.

## 2024-01-14 ENCOUNTER — Ambulatory Visit
Admission: EM | Admit: 2024-01-14 | Discharge: 2024-01-14 | Disposition: A | Discharge status: Home / Self Care | Attending: Emergency Medicine | Admitting: Emergency Medicine

## 2024-01-14 ENCOUNTER — Encounter: Payer: Self-pay | Admitting: Emergency Medicine

## 2024-01-14 DIAGNOSIS — J069 Acute upper respiratory infection, unspecified: Secondary | ICD-10-CM | POA: Diagnosis present

## 2024-01-14 DIAGNOSIS — H66001 Acute suppurative otitis media without spontaneous rupture of ear drum, right ear: Secondary | ICD-10-CM | POA: Insufficient documentation

## 2024-01-14 LAB — GROUP A STREP BY PCR: Group A Strep by PCR: NOT DETECTED

## 2024-01-14 MED ORDER — BENZONATATE 100 MG PO CAPS: 200.0000 mg | ORAL_CAPSULE | Freq: Three times a day (TID) | ORAL | 0 refills | Status: AC

## 2024-01-14 MED ORDER — PROMETHAZINE-DM 6.25-15 MG/5ML PO SYRP: 5.0000 mL | ORAL_SOLUTION | Freq: Four times a day (QID) | ORAL | 0 refills | Status: AC | PRN

## 2024-01-14 MED ORDER — IPRATROPIUM BROMIDE 0.06 % NA SOLN: 2.0000 | Freq: Four times a day (QID) | NASAL | 12 refills | Status: AC

## 2024-01-14 MED ORDER — AMOXICILLIN-POT CLAVULANATE 400-57 MG/5ML PO SUSR: 875.0000 mg | Freq: Two times a day (BID) | ORAL | 0 refills | Status: AC

## 2024-01-14 NOTE — ED Provider Notes (Addendum)
 [facility]    CSN: [id number] Arrival date & time: 01/14/24  1443      History   Chief Complaint Chief Complaint  Patient presents with   Nasal Congestion   Cough    HPI Sherry Nunez is a 28 y.o. female.   HPI  28 year old female with past medical head significant for asthma, irregular menses, and ovarian cyst presents for evaluation of respiratory symptoms that been going on for last 9 days include runny nose, nasal congestion, sneezing, sore throat, right ear pain, and a cough that is productive for white sputum.  No shortness of breath or wheezing.  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    Patient Active Problem List   Diagnosis Date Noted   PTSD (post-traumatic stress disorder) 08/08/2023   B12 deficiency 06/02/2023   Vitamin D insufficiency 06/02/2023   History of alcohol abuse 06/01/2023   Pelvic pain in female 06/01/2023   Penicillin allergy 06/01/2023   Tobacco use 06/01/2023   History of chlamydia 09/08/2021   Stress at home 05/20/2021   Family history of clotting disorder 10/22/2018   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   Lumbago 10/14/2016   Hypothyroid 08/25/2016   History of trichomoniasis 08/25/2016   BMI 34.0-34.9,adult 08/25/2016   Asthma 07/08/2015   Allergic rhinitis 07/08/2015   Mild intermittent asthma without complication 07/08/2015   Simple constipation 02/13/2012   Generalized abdominal pain    Right ovarian cyst     Past Surgical History:  Procedure Laterality Date   OVARIAN CYST REMOVAL Right 06/08/2009   benign cystadenoma    OB History     Gravida  1   Para  1   Term  1   Preterm      AB      Living  1      SAB      IAB      Ectopic      Multiple  0   Live  Births  1            Home Medications    Prior to Admission medications   Medication Sig Start Date End Date Taking? Authorizing Provider  amoxicillin -clavulanate (AUGMENTIN ) 400-57 MG/5ML suspension Take 10.9 mLs (875 mg total) by mouth 2 (two) times daily for 10 days. 01/14/24 01/24/24 Yes Bernardino Ditch, NP  benzonatate  (TESSALON ) 100 MG capsule Take 2 capsules (200 mg total) by mouth every 8 (eight) hours. 01/14/24  Yes Bernardino Ditch, NP  ipratropium (ATROVENT ) 0.06 % nasal spray Place 2 sprays into both nostrils 4 (four) times daily. 01/14/24  Yes Bernardino Ditch, NP  promethazine -dextromethorphan (PROMETHAZINE -DM) 6.25-15 MG/5ML syrup Take 5 mLs by mouth 4 (four) times daily as needed. 01/14/24  Yes Bernardino Ditch, NP  albuterol  (VENTOLIN  HFA) 108 (90 Base) MCG/ACT inhaler Inhale 2 puffs into the lungs every 4 (four) hours as needed. 10/03/22   Bernardino Ditch, NP  cyanocobalamin (VITAMIN B12) 1000 MCG/ML injection Inject 1 mL (1,000 mcg total) into the muscle once a week 09/06/23   [provider]  doxazosin (CARDURA) 1 MG tablet SMARTSIG:By Mouth 09/15/23   [provider]  sertraline (ZOLOFT) 25 MG tablet SMARTSIG:By Mouth 09/26/23   [provider]    Family History Family History  Problem Relation Age of Onset   Breast cancer Mother    Bipolar disorder Mother    Healthy Father    GER disease Maternal Uncle     Social History Social History   Tobacco Use   Smoking status: Every Day    Current packs/day: 0.30    Types: Cigarettes   Smokeless tobacco: Never  Vaping Use   Vaping status: Never Used  Substance Use Topics   Alcohol use: Not Currently    Comment: occ   Drug use: No    Comment: Former/quit 09/2016     Allergies   Penicillins   Review of Systems Review of Systems  Constitutional:  Negative for fever.  HENT:  Positive for congestion, ear pain, rhinorrhea and sore throat.   Respiratory:  Positive for cough. Negative for shortness of breath  and wheezing.      Physical Exam Triage Vital Signs ED Triage Vitals  Encounter Vitals Group     BP      Girls Systolic BP Percentile      Girls Diastolic BP Percentile      Boys Systolic BP Percentile      Boys Diastolic BP Percentile      Pulse      Resp      Temp      Temp src      SpO2      Weight      Height      Head Circumference      Peak Flow      Pain Score      Pain Loc      Pain Education      Exclude from Growth Chart    No data found.  Updated Vital Signs BP 110/76 (BP Location: Right Arm)   Pulse 65   Temp 98 F (36.7 C) (Oral)   Resp 14   Ht 5' 2 (1.575 m)   Wt 130 lb 1.1 oz (59 kg)   LMP 01/03/2024 (Approximate)   SpO2 97%   BMI 23.79 kg/m   Visual Acuity Right Eye Distance:   Left Eye Distance:   Bilateral Distance:    Right Eye Near:   Left Eye Near:    Bilateral Near:     Physical Exam Vitals and nursing note reviewed.  Constitutional:      Appearance: Normal appearance. She is not ill-appearing.  HENT:     Head: Normocephalic and atraumatic.     Right Ear: Ear canal and external ear normal. There is no impacted cerumen.     Left Ear: Tympanic membrane, ear canal and external ear normal. There is no impacted cerumen.     Ears:     Comments: Right TM is erythematous and injected with loss landmarks.  Left TM is pearly gray in appearance.  Both EACs are clear.    Nose: Congestion and rhinorrhea present.     Comments: Nasal mucosa is edematous and erythematous with clear discharge in both naris.    Mouth/Throat:     Mouth: Mucous membranes are moist.     Pharynx: Oropharynx is clear. Posterior oropharyngeal erythema present. No oropharyngeal exudate.     Comments: Soft palate is erythematous.  Tonsillar pillars are recessed.  Posterior oropharynx demonstrates erythema with clear postnasal drip. Cardiovascular:     Rate and Rhythm: Normal rate and regular rhythm.     Pulses: Normal pulses.     Heart sounds: Normal  heart sounds. No  murmur heard.    No friction rub. No gallop.  Pulmonary:     Effort: Pulmonary effort is normal.     Breath sounds: Normal breath sounds. No wheezing, rhonchi or rales.  Musculoskeletal:     Cervical back: Normal range of motion and neck supple. No tenderness.  Lymphadenopathy:     Cervical: No cervical adenopathy.  Skin:    General: Skin is warm and dry.     Capillary Refill: Capillary refill takes less than 2 seconds.     Findings: No rash.  Neurological:     General: No focal deficit present.     Mental Status: She is alert and oriented to person, place, and time.      UC Treatments / Results  Labs (all labs ordered are listed, but only abnormal results are displayed) Labs Reviewed  GROUP A STREP BY PCR    EKG   Radiology No results found.  Procedures Procedures (including critical care time)  Medications Ordered in UC Medications - No data to display  Initial Impression / Assessment and Plan / UC Course  I have reviewed the triage vital signs and the nursing notes.  Pertinent labs & imaging results that were available during my care of the patient were reviewed by me and considered in my medical decision making (see chart for details).   Patient is a nontoxic-appearing 28 year old female presenting for evaluation 9 days for the respiratory symptoms as outlined HPI above.  She came in today because when she woke up she felt like she was swallowing razor blades.  She has not had a fever.  The remainder of her physical exam does reveal an erythematous injected tympanic membrane on the right.  Her left TM is pearly gray in appearance.  Nasal mucosa is edematous and erythematous with clear discharge in both nares.  Oropharyngeal exam reveals recessed tonsillar pillars without appreciable exudate.  Posterior oropharynx demonstrates erythema with clear postnasal drip.  No cervical lymphadenopathy present on exam.  Cardiopulmonary exam reveals clear lung sounds in all fields.   I will discharge the patient home with a diagnosis of URI with cough and congestion and otitis media.  I will start her on a 10-day course of Augmentin  that will cover for potential strep.  A strep PCR was collected at triage and is pending.  Patient has a documented history of penicillin allergy in her chart though she received allergy testing as well as oral challenge of amoxicillin  at [facility] on 06/09/2023 without any reaction.  I will also prescribe Atrovent  nasal spray for nasal congestion.  Tessalon  Perles and Promethazine  DM cough syrup for cough and congestion.  Strep PCR negative.  Final Clinical Impressions(s) / UC Diagnoses   Final diagnoses:  URI with cough and congestion  Non-recurrent acute suppurative otitis media of right ear without spontaneous rupture of tympanic membrane     Discharge Instructions      Take the Augmentin  twice daily with food for 10 days for treatment of your upper respiratory tract infection and your ear infection.  Use over-the-counter Tylenol  and/or ibuprofen  Corda pack instructions as needed for any fever or pain.Use the Atrovent  nasal spray, 2 squirts in each nostril every 6 hours, as needed for runny nose and postnasal drip.  Use the Tessalon  Perles every 8 hours during the day.  Take them with a small sip of water.  They may give you some numbness to the base of your tongue or a metallic taste in  your mouth, this is normal.  Use the Promethazine  DM cough syrup at bedtime for cough and congestion.  It will make you drowsy so do not take it during the day.  Return for reevaluation or see your primary care provider for any new or worsening symptoms.      ED Prescriptions     Medication Sig Dispense Auth. Provider   amoxicillin -clavulanate (AUGMENTIN ) 400-57 MG/5ML suspension Take 10.9 mLs (875 mg total) by mouth 2 (two) times daily for 10 days. 218 mL Bernardino Ditch, NP   benzonatate  (TESSALON ) 100 MG capsule Take 2 capsules (200 mg total) by mouth  every 8 (eight) hours. 21 capsule Bernardino Ditch, NP   ipratropium (ATROVENT ) 0.06 % nasal spray Place 2 sprays into both nostrils 4 (four) times daily. 15 mL Bernardino Ditch, NP   promethazine -dextromethorphan (PROMETHAZINE -DM) 6.25-15 MG/5ML syrup Take 5 mLs by mouth 4 (four) times daily as needed. 118 mL Bernardino Ditch, NP      PDMP not reviewed this encounter.   Bernardino Ditch, NP 01/14/24 1518    Bernardino Ditch, NP 01/14/24 [id number]    Bernardino Ditch, NP 01/17/24 [phone]

## 2024-01-14 NOTE — ED Triage Notes (Signed)
 Patient c/o cough, congestion, nasal congestion and sneezing that started over a week ago.  Patient denies fevers. Patient reports bodyaches also.  Patient reports sore throat that started today.

## 2024-01-14 NOTE — Discharge Instructions (Signed)
 Take the Augmentin  twice daily with food for 10 days for treatment of your upper respiratory tract infection and your ear infection.  Use over-the-counter Tylenol  and/or ibuprofen  Corda pack instructions as needed for any fever or pain.Use the Atrovent  nasal spray, 2 squirts in each nostril every 6 hours, as needed for runny nose and postnasal drip.  Use the Tessalon  Perles every 8 hours during the day.  Take them with a small sip of water.  They may give you some numbness to the base of your tongue or a metallic taste in your mouth, this is normal.  Use the Promethazine  DM cough syrup at bedtime for cough and congestion.  It will make you drowsy so do not take it during the day.  Return for reevaluation or see your primary care provider for any new or worsening symptoms.
